# Patient Record
Sex: Male | Born: 1950 | Race: White | Hispanic: No | Marital: Married | Smoking: Former smoker
Health system: Southern US, Community
[De-identification: ages and names within clinical notes are randomized; demographics above are authoritative.]

## PROBLEM LIST (undated history)

## (undated) DIAGNOSIS — E785 Hyperlipidemia, unspecified: Secondary | ICD-10-CM

## (undated) DIAGNOSIS — I10 Essential (primary) hypertension: Secondary | ICD-10-CM

---

## 2000-11-07 ENCOUNTER — Ambulatory Visit (HOSPITAL_COMMUNITY): Admission: RE | Admit: 2000-11-07 | Discharge: 2000-11-07 | Payer: Self-pay | Admitting: Gastroenterology

## 2002-11-20 ENCOUNTER — Ambulatory Visit (HOSPITAL_COMMUNITY): Admission: RE | Admit: 2002-11-20 | Discharge: 2002-11-20 | Payer: Self-pay | Admitting: Orthopedic Surgery

## 2002-11-20 ENCOUNTER — Encounter: Payer: Self-pay | Admitting: Orthopedic Surgery

## 2003-07-05 ENCOUNTER — Inpatient Hospital Stay (HOSPITAL_COMMUNITY): Admission: EM | Admit: 2003-07-05 | Discharge: 2003-07-07 | Payer: Self-pay | Admitting: Emergency Medicine

## 2003-07-05 ENCOUNTER — Encounter: Payer: Self-pay | Admitting: Emergency Medicine

## 2003-07-30 ENCOUNTER — Ambulatory Visit (HOSPITAL_COMMUNITY): Admission: RE | Admit: 2003-07-30 | Discharge: 2003-07-30 | Payer: Self-pay | Admitting: Cardiology

## 2004-12-29 ENCOUNTER — Emergency Department (HOSPITAL_COMMUNITY): Admission: EM | Admit: 2004-12-29 | Discharge: 2004-12-30 | Payer: Self-pay | Admitting: Emergency Medicine

## 2012-02-08 ENCOUNTER — Encounter (HOSPITAL_COMMUNITY): Payer: Self-pay

## 2012-02-08 ENCOUNTER — Emergency Department (HOSPITAL_COMMUNITY)
Admission: EM | Admit: 2012-02-08 | Discharge: 2012-02-09 | Disposition: A | Discharge status: Home / Self Care | Payer: Worker's Compensation | Attending: Emergency Medicine | Admitting: Emergency Medicine

## 2012-02-08 ENCOUNTER — Encounter (HOSPITAL_COMMUNITY): Payer: Self-pay | Admitting: *Deleted

## 2012-02-08 ENCOUNTER — Emergency Department (INDEPENDENT_AMBULATORY_CARE_PROVIDER_SITE_OTHER)
Admission: EM | Admit: 2012-02-08 | Discharge: 2012-02-08 | Disposition: A | Discharge status: Nursing Home (Includes ICF) | Payer: BC Managed Care – PPO | Source: Home / Self Care | Attending: Emergency Medicine | Admitting: Emergency Medicine

## 2012-02-08 DIAGNOSIS — T23409A Corrosion of unspecified degree of unspecified hand, unspecified site, initial encounter: Secondary | ICD-10-CM

## 2012-02-08 DIAGNOSIS — Y9269 Other specified industrial and construction area as the place of occurrence of the external cause: Secondary | ICD-10-CM | POA: Insufficient documentation

## 2012-02-08 DIAGNOSIS — T23299A Burn of second degree of multiple sites of unspecified wrist and hand, initial encounter: Secondary | ICD-10-CM | POA: Insufficient documentation

## 2012-02-08 DIAGNOSIS — T23009A Burn of unspecified degree of unspecified hand, unspecified site, initial encounter: Secondary | ICD-10-CM

## 2012-02-08 DIAGNOSIS — T310 Burns involving less than 10% of body surface: Secondary | ICD-10-CM | POA: Insufficient documentation

## 2012-02-08 DIAGNOSIS — IMO0002 Reserved for concepts with insufficient information to code with codable children: Secondary | ICD-10-CM | POA: Insufficient documentation

## 2012-02-08 DIAGNOSIS — T304 Corrosion of unspecified body region, unspecified degree: Secondary | ICD-10-CM

## 2012-02-08 LAB — DIFFERENTIAL
Basophils Absolute: 0 10*3/uL (ref 0.0–0.1)
Basophils Relative: 0 % (ref 0–1)
Eosinophils Absolute: 0.1 10*3/uL (ref 0.0–0.7)
Eosinophils Relative: 1 % (ref 0–5)
Lymphocytes Relative: 17 % (ref 12–46)
Lymphs Abs: 2.1 10*3/uL (ref 0.7–4.0)
Monocytes Absolute: 0.6 10*3/uL (ref 0.1–1.0)
Monocytes Relative: 5 % (ref 3–12)
Neutro Abs: 9.5 10*3/uL — ABNORMAL HIGH (ref 1.7–7.7)
Neutrophils Relative %: 77 % (ref 43–77)

## 2012-02-08 LAB — CBC
HCT: 40.7 % (ref 39.0–52.0)
Hemoglobin: 14.4 g/dL (ref 13.0–17.0)
MCH: 31.9 pg (ref 26.0–34.0)
MCHC: 35.4 g/dL (ref 30.0–36.0)
MCV: 90.2 fL (ref 78.0–100.0)
Platelets: 226 10*3/uL (ref 150–400)
RBC: 4.51 MIL/uL (ref 4.22–5.81)
RDW: 13.2 % (ref 11.5–15.5)
WBC: 12.3 10*3/uL — ABNORMAL HIGH (ref 4.0–10.5)

## 2012-02-08 LAB — BASIC METABOLIC PANEL
BUN: 19 mg/dL (ref 6–23)
CO2: 26 mEq/L (ref 19–32)
Calcium: 9.2 mg/dL (ref 8.4–10.5)
Chloride: 103 mEq/L (ref 96–112)
Creatinine, Ser: 1.19 mg/dL (ref 0.50–1.35)
GFR calc Af Amer: 76 mL/min — ABNORMAL LOW (ref >90)
GFR calc non Af Amer: 65 mL/min — ABNORMAL LOW (ref >90)
Glucose, Bld: 122 mg/dL — ABNORMAL HIGH (ref 70–99)
Potassium: 3.7 mEq/L (ref 3.5–5.1)
Sodium: 142 mEq/L (ref 135–145)

## 2012-02-08 LAB — MAGNESIUM: Magnesium: 2.2 mg/dL (ref 1.5–2.5)

## 2012-02-08 LAB — CALCIUM: Calcium: 9.1 mg/dL (ref 8.4–10.5)

## 2012-02-08 MED ORDER — HYDROMORPHONE HCL PF 1 MG/ML IJ SOLN
INTRAMUSCULAR | Status: AC
  Filled 2012-02-08: qty 2

## 2012-02-08 MED ORDER — FENTANYL CITRATE 0.05 MG/ML IJ SOLN
50.0000 ug | Freq: Once | INTRAMUSCULAR | Status: AC
  Administered 2012-02-08: 50 ug via INTRAVENOUS
  Filled 2012-02-08: qty 2

## 2012-02-08 MED ORDER — HYDROMORPHONE HCL PF 1 MG/ML IJ SOLN
1.0000 mg | Freq: Once | INTRAMUSCULAR | Status: AC
  Administered 2012-02-08: 1 mg via INTRAVENOUS
  Filled 2012-02-08: qty 1

## 2012-02-08 MED ORDER — HYDROMORPHONE HCL PF 1 MG/ML IJ SOLN: 1.0000 mg | Freq: Once | INTRAMUSCULAR | Status: DC

## 2012-02-08 MED ORDER — CALCIUM GLUCONATE TOPICAL GEL 2.5 %
Freq: Once | TOPICAL | Status: DC
  Filled 2012-02-08: qty 100

## 2012-02-08 MED ORDER — DIPHENHYDRAMINE HCL 50 MG/ML IJ SOLN
12.5000 mg | Freq: Once | INTRAMUSCULAR | Status: AC
  Administered 2012-02-08: 12.5 mg via INTRAVENOUS
  Filled 2012-02-08: qty 1

## 2012-02-08 MED ORDER — ONDANSETRON 4 MG PO TBDP
ORAL_TABLET | ORAL | Status: AC
  Filled 2012-02-08: qty 1

## 2012-02-08 MED ORDER — SODIUM CHLORIDE 0.9 % IV SOLN
Freq: Once | INTRAVENOUS | Status: AC
  Administered 2012-02-08: 125 mL/h via INTRAVENOUS

## 2012-02-08 MED ORDER — HYDROMORPHONE HCL PF 2 MG/ML IJ SOLN
2.0000 mg | Freq: Once | INTRAMUSCULAR | Status: AC
  Administered 2012-02-08: 2 mg via INTRAVENOUS
  Filled 2012-02-08: qty 1

## 2012-02-08 MED ORDER — ONDANSETRON HCL 4 MG/2ML IJ SOLN
4.0000 mg | Freq: Once | INTRAMUSCULAR | Status: AC
  Administered 2012-02-08: 4 mg via INTRAVENOUS
  Filled 2012-02-08: qty 2

## 2012-02-08 MED ORDER — CALCIUM GLUCONATE TOPICAL GEL 2.5 %
Freq: Once | TOPICAL | Status: AC
  Administered 2012-02-08: 14:00:00 via TOPICAL

## 2012-02-08 MED ORDER — CALCIUM CARBONATE 1250 MG/5ML PO SUSP
6000.0000 mg | ORAL | Status: AC
  Administered 2012-02-08: 6000 mg
  Filled 2012-02-08: qty 60

## 2012-02-08 MED ORDER — HYDROMORPHONE HCL PF 1 MG/ML IJ SOLN
2.0000 mg | Freq: Once | INTRAMUSCULAR | Status: AC
Start: 2012-02-08 — End: 2012-02-08
  Administered 2012-02-08: 2 mg via INTRAMUSCULAR

## 2012-02-08 MED ORDER — PERCOCET 5-325 MG PO TABS: 1.0000 | ORAL_TABLET | ORAL | Status: AC | PRN

## 2012-02-08 MED ORDER — ONDANSETRON 4 MG PO TBDP
4.0000 mg | ORAL_TABLET | Freq: Once | ORAL | Status: AC
  Administered 2012-02-08: 4 mg via ORAL

## 2012-02-08 NOTE — Discharge Instructions (Signed)
As we discussed, please crush 20-30 Tums and mix with KY Jelly, coat your hand and put a loose fitting glove over it.  Massage your hand through the glove for 30 minutes.  You should do this every time you have increasing pain.  You will also need to wear the glove with the Tums mixture in it overnight.  If you continue to have pain tomorrow, please call the [facility] for follow up.  They know about you and will expect your call.  Please also follow up with the hand surgeon on Tuesday.  Take the pain medication as needed for pain, but do not use this instead of the glove and Tums ointment.  Do not take additional tylenol while using this pain medication.  You may return to the ER at any time for worsening condition or any new symptoms that concern you.   *If you are unable to find Tums or KY Jelly, please speak with a pharmacist.   You need to have calcium go through your skin per poison control.  This is very important.  If the pharmacist needs to, they may also contact poison control and reference your name to discuss your case*

## 2012-02-08 NOTE — ED Notes (Signed)
Pt reports he thought he was spraying degreaser on his hands but sprayed acid on them, causing chemical burns to his right hand. Pt arrives with hand covered in calcium gluconate. Medicated

## 2012-02-08 NOTE — ED Notes (Signed)
Pt presents with acid burns to R hand and fingers at 1100 today.  Pt reports they placed calcium gluconate to fingers, has glove on hand in triage.

## 2012-02-08 NOTE — ED Notes (Signed)
C/o itching since last medicine was given.  Mid level informed

## 2012-02-08 NOTE — ED Provider Notes (Signed)
History     CSN: [id number]  Arrival date & time 02/08/12  1303   First MD Initiated Contact with Patient 02/08/12 1331      Chief Complaint  Patient presents with  . Chemical Exposure    (Consider location/radiation/quality/duration/timing/severity/associated sxs/prior treatment) HPI Comments: Patient is a right-handed male who spilled "hydro-foam concentrate", which contains phosphoric acid and hydrogen flouride onto his right hand at 11:00 today. States that it splashed from the knuckles to his fingertips. He was not wearing gloves. He irrigated it 30 minutes with water, then soaked it in an unknown chemical, thinks it was a detergent of some sort.  Pain is worse with holding his hand below his heart, and better with elevation. He is currently holding it above his head. No limitation of motion, break in skin. No nausea, vomiting, fevers. No h/o diabetes, pt is not a smoker.   ROS as noted in HPI. All other ROS negative.   Patient is a 61 y.o. male presenting with hand injury. The history is provided by the patient. No language interpreter was used.  Hand Injury     History reviewed. No pertinent past medical history.  History reviewed. No pertinent past surgical history.  No family history on file.  History  Substance Use Topics  . Smoking status: Not on file  . Smokeless tobacco: Not on file  . Alcohol Use: Not on file      Review of Systems  Allergies  Penicillins  Home Medications  No current outpatient prescriptions on file.  BP 164/100  Pulse 71  Temp(Src) 97 F (36.1 C) (Oral)  Resp 18  SpO2 98%  Physical Exam  Nursing note and vitals reviewed. Constitutional: He is oriented to person, place, and time. He appears well-developed and well-nourished. He appears distressed.  HENT:  Head: Normocephalic and atraumatic.  Eyes: Conjunctivae and EOM are normal.  Neck: Normal range of motion.  Cardiovascular: Normal rate.   Pulmonary/Chest: Effort normal.  No respiratory distress.  Abdominal: He exhibits no distension.  Musculoskeletal: Normal range of motion.       Right hand: normal sensation noted. Normal strength noted.       Hands:      Whitish discoloration of the fingertips. Nailbeds blanched. Skin intact. Sensation grossly intact. Patient is able to move all fingers.  Neurological: He is alert and oriented to person, place, and time.  Skin: Skin is warm and dry.  Psychiatric: His speech is normal and behavior is normal. His mood appears anxious.    ED Course  Procedures (including critical care time)  Labs Reviewed - No data to display No results found.   1. Chemical burn of hand    MDM  Patient appears to be in significant amount of pain. Has some blanching of his fingertips and nailbeds. Applied calcium gluconate gel  2.5% to hand, advise patient to massage this into his hand extensively esp into nailbeds, and put a glove top of this. Gave 2 g of Dilaudid and 4 mg of Zofran. Discussed case with Beth, at poison control. Concentration HFl is less than 20% in this product. She recommends baseline labs, especially calcium level. Poison control will contact [facility] for followup in several hours. Transferring to the ED.   Luiz Blare, MD 02/08/12 [phone]

## 2012-02-08 NOTE — ED Notes (Signed)
Pt given meal with okay from MD.  Pt has no rash, no shob but c/o itching

## 2012-02-08 NOTE — ED Notes (Signed)
Pt  Accidentally   pourse  An  Acid  Base  Product  On r  Hand       About  2  Hours  Ago       He  Felt  It  Burning  So  He  Washed  It   With  Water  For  At least  30  mins      As  Well  As    Following the  Guidelines  Of  The    MSDS       He  Has  Pain     In the hand  And  It   Appears  The  Same  As  The  Unaffected  Hand      -  He  Reports         The  Hand  Is  Worse      When it  Is  Hanging  Down        It  Burns  He  Reports      It  Feels  Better         At  This  Time             He  ios  Awake  And  Alert  And  Oriented

## 2012-02-08 NOTE — ED Notes (Addendum)
Dr Edmonia James Surgeon at bedside.

## 2012-02-08 NOTE — ED Notes (Signed)
pts    boss With  Him   And  Says  At  This  Point  They  Are  Not      Using  workmans     comp

## 2012-02-08 NOTE — ED Notes (Signed)
msds  Data  Sheet  Sent  With patient

## 2012-02-08 NOTE — Consult Note (Signed)
Reason for Consult:CHEMICAL BURN TO HAND Referring Physician: DR. Morley Kos is an 61 y.o. male.  HPI: ER NOTES REVIEWED PT AT WORK SUSTAINED CHEMICAL BURN TO HAND PT RECEIVED ANTIDOTE AT URGENT CARE ER HAS BEEN MONITORING CALCIUM LEVEL PT C/O PAIN TO HAND  History reviewed. No pertinent past medical history.  History reviewed. No pertinent past surgical history.  No family history on file.  Social History:  does not have a smoking history on file. He does not have any smokeless tobacco history on file. His alcohol and drug histories not on file.  Allergies:  Allergies  Allergen Reactions  . Penicillins Other (See Comments)    Childhood reaction    Medications: I have reviewed the patient's current medications.  Results for orders placed during the hospital encounter of 02/08/12 (from the past 48 hour(s))  CBC     Status: Abnormal   Collection Time   02/08/12  3:17 PM      Component Value Range Comment   WBC 12.3 (*) 4.0 - 10.5 (K/uL)    RBC 4.51  4.22 - 5.81 (MIL/uL)    Hemoglobin 14.4  13.0 - 17.0 (g/dL)    HCT 16.1  09.6 - 04.5 (%)    MCV 90.2  78.0 - 100.0 (fL)    MCH 31.9  26.0 - 34.0 (pg)    MCHC 35.4  30.0 - 36.0 (g/dL)    RDW 40.9  81.1 - 91.4 (%)    Platelets 226  150 - 400 (K/uL)   DIFFERENTIAL     Status: Abnormal   Collection Time   02/08/12  3:17 PM      Component Value Range Comment   Neutrophils Relative 77  43 - 77 (%)    Neutro Abs 9.5 (*) 1.7 - 7.7 (K/uL)    Lymphocytes Relative 17  12 - 46 (%)    Lymphs Abs 2.1  0.7 - 4.0 (K/uL)    Monocytes Relative 5  3 - 12 (%)    Monocytes Absolute 0.6  0.1 - 1.0 (K/uL)    Eosinophils Relative 1  0 - 5 (%)    Eosinophils Absolute 0.1  0.0 - 0.7 (K/uL)    Basophils Relative 0  0 - 1 (%)    Basophils Absolute 0.0  0.0 - 0.1 (K/uL)   BASIC METABOLIC PANEL     Status: Abnormal   Collection Time   02/08/12  3:17 PM      Component Value Range Comment   Sodium 142  135 - 145 (mEq/L)    Potassium 3.7   3.5 - 5.1 (mEq/L)    Chloride 103  96 - 112 (mEq/L)    CO2 26  19 - 32 (mEq/L)    Glucose, Bld 122 (*) 70 - 99 (mg/dL)    BUN 19  6 - 23 (mg/dL)    Creatinine, Ser 7.82  0.50 - 1.35 (mg/dL)    Calcium 9.2  8.4 - 10.5 (mg/dL)    GFR calc non Af Amer 65 (*) >90 (mL/min)    GFR calc Af Amer 76 (*) >90 (mL/min)   CALCIUM     Status: Normal   Collection Time   02/08/12  7:31 PM      Component Value Range Comment   Calcium 9.1  8.4 - 10.5 (mg/dL)     No results found.  NO RECENT ILLNESSES OR HOSPITALIZATIONS  Blood pressure 124/80, pulse 74, temperature 98.2 F (36.8 C), temperature source Oral, resp. rate  18, height 5\' 11"  (1.803 m), weight 86.183 kg (190 lb), SpO2 96.00%.  GEN: ALERT ORIENTED TO P/P/T IN NO ACUTE DISTRESS TALKS IN NO RESPIRATORY DIFFICULTY RIGHT HAND: CALCIUM GLUCONATE OVER DORSUM AND PALMAR SURFACE OF HAND NO FULL THICKNESS OR PARTIAL THICKNESS WOUNDS ABLE TO MAKE FULL COMPOSITE FIST FINGERS WARM WELL PERFUSED NO ASCENDING ERYTHEMA OR LYMPHANGITIS   Assessment/Plan: RIGHT HAND HF ACID EXPOSURE  Pt RECEIVED TOPICAL CALCIUM GLUCONATE NO FULL THICKNESS SKIN INVOLVEMENT NO SKIN LOSS WOULD RECOMMEND SPLINTING HAND ICE ELEVATE ORAL PAIN MEDICATIONS F/U IN [facility] OFFICE THIS WEEK   Sharma Covert 02/08/2012, 9:59 PM

## 2012-02-08 NOTE — ED Provider Notes (Signed)
This chart was scribed for Gabriel Booze, MD by Wallis Mart. The patient was seen in room STRE6/STRE6 and the patient's care was started at 3:01 PM.   CSN: [id number]  Arrival date & time 02/08/12  1442   First MD Initiated Contact with Patient 02/08/12 1457      No chief complaint on file.   (Consider location/radiation/quality/duration/timing/severity/associated sxs/prior treatment) HPI  Gabriel Gross is a 61 y.o. male who presents to the Emergency Department complaining of a chemical burn to right fingers and hand that occurred around 11 AM today.  Pt states that acid (hydro-foam concentrate which contains phosphoric and hyrdrofluoric acid) splashed on his fingers and hand while at work, pt was not wearing gloves, he irrigated the wound 30 minutes later with water and then soaked it with an unknown detergent.  Pt went to urgent care where calcium gluconate was applied to his hand. Pt was also given Dilauded and Zofran and sent to the ED.  Pt currently rates pain as 5-10/10, states that pain has worsened since the incident occurred.  There are no other associated symptoms and no other alleviating or aggravating factors.     PCP: [facility]  History reviewed. No pertinent past medical history.  History reviewed. No pertinent past surgical history.  No family history on file.  History  Substance Use Topics  . Smoking status: Not on file  . Smokeless tobacco: Not on file  . Alcohol Use: Not on file      Review of Systems  Musculoskeletal:       Chemical burn to right hand  All other systems reviewed and are negative.    Allergies  Penicillins  Home Medications   Current Outpatient Rx  Name Route Sig Dispense Refill  . ACETAMINOPHEN 500 MG PO TABS Oral Take 1,000 mg by mouth every 6 (six) hours as needed. For pain    . IBUPROFEN 200 MG PO TABS Oral Take 400 mg by mouth every 6 (six) hours as needed. For pain      BP 108/67  Pulse 42  Temp(Src) 97.8 F (36.6  C) (Oral)  Resp 16  Ht 5\' 11"  (1.803 m)  Wt 190 lb (86.183 kg)  BMI 26.50 kg/m2  SpO2 97%  Physical Exam  Nursing note and vitals reviewed. Constitutional: He is oriented to person, place, and time. He appears well-developed and well-nourished. No distress.  HENT:  Head: Normocephalic and atraumatic.  Eyes: EOM are normal.  Neck: Neck supple. No tracheal deviation present.  Cardiovascular: Normal rate.   Pulmonary/Chest: Effort normal. No respiratory distress.  Musculoskeletal: Normal range of motion.       Mild pallor of right hand with no discrete blistering or obvious burn   Neurological: He is alert and oriented to person, place, and time.  Skin: Skin is warm and dry.  Psychiatric: He has a normal mood and affect. His behavior is normal.    ED Course  Procedures (including critical care time) DIAGNOSTIC STUDIES: Oxygen Saturation is 98% on room air, normal by my interpretation.    COORDINATION OF CARE:  WEST,EMILY B  4:26 PM Patient placed in CDU by Dr Preston Fleeting, awaiting hand surgery consult.  Pt reports pain is "20" out of 10, will redose dilaudid and continue to follow.    5:03 PM Pt notes minimal improvement with Dilaudid, though he appears much more comfortable.  Is able to move all digits.    7:07 PM Patient notes itching since fentanyl.  Dr Melvyn Novas has  not been in yet to see the patient.  Discussed with Dr Preston Fleeting who states patient may eat.    9:35 PM Patient with returned pain, worse than when he came in.  Pain radiates into right forearm.  Dilaudid ordered.  Still awaiting Dr Melvyn Novas.  Dr Preston Fleeting was awaiting a call from Dr Melvyn Novas at end of his shift (8pm) and was planning to touch base about this patient.  Pt with full AROM of fingers, capillary refill < 2 seconds in all digits, sensation intact.    9:54 PM Dr Melvyn Novas here to see patient.   10:03 PM Dr Melvyn Novas has seen and examined the patient, requests bulky dressing, volar splint, f/u in his office on Tuesday,  believes he can be discharged home if calcium level does not need to be monitored closely.  I am currently calling the poison control center to discuss how patient's calcium needs to be followed.     10:11 PM I spoke with poison control representative who recommends against bulky dressing and splint.  States that this process will get worse if we do not get calcium through the skin.  States this burn does not look bad but is very painful and must be treated this way.  Pt to have calcium gluconate gel (which pharmacy states we do not have) or Tums crushed in [place] Jelly.    10:25 PM I have spoken with the pharmacist who confirms we do not have calcium gluconate and states we do not have KY Jelly, is concerned about surgi-lube interacting with the calcium.  Recommends calcium carbonate liquid, as this is what is available.    10:50 PM I have spoken with Dr Golda Acre who agrees with following poison control recommendations.  Patient currently reporting pain is 10/10 (actually "200" out of 10).  Pt states he will not tolerate trial with massage without IV pain medication, which I have ordered.    11:34 PM Patient reports massage greatly improved his pain.  States he is now 7/10.  States he can do this at home as I have explained the tums/ky jelly mixture and he states he would like to go home.  Called poison control again, states he should repeat as needed and definitely leave the glove on overnight.  Pt to call poison control tomorrow for follow up if he is still in pain.  Pt d/c home with care instructions, pain medication for additional pain control.  Patient verbalizes understanding and agrees with plan.    End section by WEST,EMILY B    Results for orders placed during the hospital encounter of 02/08/12  CBC      Component Value Range   WBC 12.3 (*) 4.0 - 10.5 K/uL   RBC 4.51  4.22 - 5.81 MIL/uL   Hemoglobin 14.4  13.0 - 17.0 g/dL   HCT 47.8  29.5 - 62.1 %   MCV 90.2  78.0 - 100.0 fL   MCH 31.9  26.0  - 34.0 pg   MCHC 35.4  30.0 - 36.0 g/dL   RDW 30.8  65.7 - 84.6 %   Platelets 226  150 - 400 K/uL  DIFFERENTIAL      Component Value Range   Neutrophils Relative 77  43 - 77 %   Neutro Abs 9.5 (*) 1.7 - 7.7 K/uL   Lymphocytes Relative 17  12 - 46 %   Lymphs Abs 2.1  0.7 - 4.0 K/uL   Monocytes Relative 5  3 - 12 %   Monocytes  Absolute 0.6  0.1 - 1.0 K/uL   Eosinophils Relative 1  0 - 5 %   Eosinophils Absolute 0.1  0.0 - 0.7 K/uL   Basophils Relative 0  0 - 1 %   Basophils Absolute 0.0  0.0 - 0.1 K/uL  BASIC METABOLIC PANEL      Component Value Range   Sodium 142  135 - 145 mEq/L   Potassium 3.7  3.5 - 5.1 mEq/L   Chloride 103  96 - 112 mEq/L   CO2 26  19 - 32 mEq/L   Glucose, Bld 122 (*) 70 - 99 mg/dL   BUN 19  6 - 23 mg/dL   Creatinine, Ser 5.62  0.50 - 1.35 mg/dL   Calcium 9.2  8.4 - 13.0 mg/dL   GFR calc non Af Amer 65 (*) >90 mL/min   GFR calc Af Amer 76 (*) >90 mL/min  CALCIUM      Component Value Range   Calcium 9.1  8.4 - 10.5 mg/dL  MAGNESIUM      Component Value Range   Magnesium 2.2  1.5 - 2.5 mg/dL     1. Chemical burn       MDM  Patient with chemical burn to hand, followed poison control instructions of topical calcium which helped patient.  Calcium and magnesium levels normal, calcium with no significant change in ED.  Pt d/c home with instructions per poison control.  Patient verbalizes understanding and agrees with plan.      I personally performed the services described in this documentation, which was scribed in my presence. The recorded information has been reviewed and considered.      Gabriel Booze, MD 03/03/12 [phone]

## 2012-02-08 NOTE — ED Notes (Addendum)
Wife at bedside.  Room darkened for comfort

## 2012-02-08 NOTE — ED Notes (Signed)
Dr Fredric Dine  With    Poison  control

## 2015-09-26 ENCOUNTER — Encounter (HOSPITAL_COMMUNITY): Payer: Self-pay | Admitting: Emergency Medicine

## 2015-09-26 ENCOUNTER — Inpatient Hospital Stay (HOSPITAL_COMMUNITY)
Admission: EM | Admit: 2015-09-26 | Discharge: 2015-09-28 | DRG: 193 | Disposition: A | Discharge status: Home / Self Care | Payer: BLUE CROSS/BLUE SHIELD | Attending: Family Medicine | Admitting: Family Medicine

## 2015-09-26 ENCOUNTER — Inpatient Hospital Stay (HOSPITAL_COMMUNITY): Payer: BLUE CROSS/BLUE SHIELD

## 2015-09-26 ENCOUNTER — Emergency Department (HOSPITAL_COMMUNITY): Payer: BLUE CROSS/BLUE SHIELD

## 2015-09-26 DIAGNOSIS — R0602 Shortness of breath: Secondary | ICD-10-CM | POA: Diagnosis not present

## 2015-09-26 DIAGNOSIS — E785 Hyperlipidemia, unspecified: Secondary | ICD-10-CM | POA: Diagnosis present

## 2015-09-26 DIAGNOSIS — Z880 Allergy status to penicillin: Secondary | ICD-10-CM | POA: Diagnosis not present

## 2015-09-26 DIAGNOSIS — J9601 Acute respiratory failure with hypoxia: Secondary | ICD-10-CM | POA: Diagnosis present

## 2015-09-26 DIAGNOSIS — E860 Dehydration: Secondary | ICD-10-CM | POA: Diagnosis present

## 2015-09-26 DIAGNOSIS — J181 Lobar pneumonia, unspecified organism: Principal | ICD-10-CM | POA: Diagnosis present

## 2015-09-26 DIAGNOSIS — J189 Pneumonia, unspecified organism: Secondary | ICD-10-CM | POA: Diagnosis not present

## 2015-09-26 DIAGNOSIS — E876 Hypokalemia: Secondary | ICD-10-CM | POA: Diagnosis present

## 2015-09-26 DIAGNOSIS — J180 Bronchopneumonia, unspecified organism: Secondary | ICD-10-CM | POA: Diagnosis present

## 2015-09-26 DIAGNOSIS — T502X5A Adverse effect of carbonic-anhydrase inhibitors, benzothiadiazides and other diuretics, initial encounter: Secondary | ICD-10-CM | POA: Diagnosis present

## 2015-09-26 DIAGNOSIS — Z87891 Personal history of nicotine dependence: Secondary | ICD-10-CM

## 2015-09-26 DIAGNOSIS — I10 Essential (primary) hypertension: Secondary | ICD-10-CM | POA: Diagnosis present

## 2015-09-26 DIAGNOSIS — Z79899 Other long term (current) drug therapy: Secondary | ICD-10-CM | POA: Diagnosis not present

## 2015-09-26 DIAGNOSIS — D72829 Elevated white blood cell count, unspecified: Secondary | ICD-10-CM | POA: Diagnosis present

## 2015-09-26 DIAGNOSIS — N179 Acute kidney failure, unspecified: Secondary | ICD-10-CM | POA: Diagnosis present

## 2015-09-26 HISTORY — DX: Essential (primary) hypertension: I10

## 2015-09-26 HISTORY — DX: Hyperlipidemia, unspecified: E78.5

## 2015-09-26 LAB — CBC
HEMATOCRIT: 41.1 % (ref 39.0–52.0)
Hemoglobin: 14.1 g/dL (ref 13.0–17.0)
MCH: 32 pg (ref 26.0–34.0)
MCHC: 34.3 g/dL (ref 30.0–36.0)
MCV: 93.4 fL (ref 78.0–100.0)
Platelets: 482 10*3/uL — ABNORMAL HIGH (ref 150–400)
RBC: 4.4 MIL/uL (ref 4.22–5.81)
RDW: 12.6 % (ref 11.5–15.5)
WBC: 15.2 10*3/uL — ABNORMAL HIGH (ref 4.0–10.5)

## 2015-09-26 LAB — URINALYSIS, ROUTINE W REFLEX MICROSCOPIC
GLUCOSE, UA: NEGATIVE mg/dL
Hgb urine dipstick: NEGATIVE
KETONES UR: NEGATIVE mg/dL
Leukocytes, UA: NEGATIVE
NITRITE: NEGATIVE
PH: 5.5 (ref 5.0–8.0)
Protein, ur: NEGATIVE mg/dL
Specific Gravity, Urine: 1.027 (ref 1.005–1.030)

## 2015-09-26 LAB — PROCALCITONIN: Procalcitonin: <0.1 ng/mL

## 2015-09-26 LAB — BASIC METABOLIC PANEL
Anion gap: 13 (ref 5–15)
BUN: 26 mg/dL — AB (ref 6–20)
CHLORIDE: 99 mmol/L — AB (ref 101–111)
CO2: 27 mmol/L (ref 22–32)
CREATININE: 1.25 mg/dL — AB (ref 0.61–1.24)
Calcium: 9.1 mg/dL (ref 8.9–10.3)
GFR calc Af Amer: >60 mL/min (ref >60)
GFR, EST NON AFRICAN AMERICAN: 60 mL/min — AB (ref >60)
GLUCOSE: 134 mg/dL — AB (ref 65–99)
Potassium: 3.4 mmol/L — ABNORMAL LOW (ref 3.5–5.1)
Sodium: 139 mmol/L (ref 135–145)

## 2015-09-26 LAB — TROPONIN I

## 2015-09-26 LAB — INFLUENZA PANEL BY PCR (TYPE A & B)
H1N1FLUPCR: NOT DETECTED
INFLAPCR: NEGATIVE
INFLBPCR: NEGATIVE

## 2015-09-26 LAB — TSH: TSH: 1.929 u[IU]/mL (ref 0.350–4.500)

## 2015-09-26 LAB — STREP PNEUMONIAE URINARY ANTIGEN: Strep Pneumo Urinary Antigen: NEGATIVE

## 2015-09-26 LAB — MAGNESIUM: MAGNESIUM: 2 mg/dL (ref 1.7–2.4)

## 2015-09-26 LAB — I-STAT CG4 LACTIC ACID, ED: Lactic Acid, Venous: 1.96 mmol/L (ref 0.5–2.0)

## 2015-09-26 LAB — CBG MONITORING, ED: Glucose-Capillary: 109 mg/dL — ABNORMAL HIGH (ref 65–99)

## 2015-09-26 LAB — LACTIC ACID, PLASMA: Lactic Acid, Venous: 1.5 mmol/L (ref 0.5–2.0)

## 2015-09-26 MED ORDER — IOHEXOL 350 MG/ML SOLN
100.0000 mL | Freq: Once | INTRAVENOUS | Status: AC | PRN
  Administered 2015-09-26: 100 mL via INTRAVENOUS

## 2015-09-26 MED ORDER — SODIUM CHLORIDE 0.9 % IV BOLUS (SEPSIS)
500.0000 mL | Freq: Once | INTRAVENOUS | Status: AC
  Administered 2015-09-26: 500 mL via INTRAVENOUS

## 2015-09-26 MED ORDER — DEXTROSE 5 % IV SOLN
500.0000 mg | INTRAVENOUS | Status: DC
  Administered 2015-09-26 – 2015-09-27 (×2): 500 mg via INTRAVENOUS
  Filled 2015-09-26 (×4): qty 500

## 2015-09-26 MED ORDER — ALBUTEROL SULFATE (2.5 MG/3ML) 0.083% IN NEBU
5.0000 mg | INHALATION_SOLUTION | Freq: Once | RESPIRATORY_TRACT | Status: AC
  Administered 2015-09-26: 5 mg via RESPIRATORY_TRACT
  Filled 2015-09-26: qty 6

## 2015-09-26 MED ORDER — POTASSIUM CHLORIDE IN NACL 20-0.9 MEQ/L-% IV SOLN
INTRAVENOUS | Status: DC
Start: 2015-09-26 — End: 2015-09-28
  Administered 2015-09-26: 125 mL/h via INTRAVENOUS
  Administered 2015-09-27: 15:00:00 via INTRAVENOUS
  Filled 2015-09-26 (×5): qty 1000

## 2015-09-26 MED ORDER — ENOXAPARIN SODIUM 40 MG/0.4ML ~~LOC~~ SOLN
40.0000 mg | SUBCUTANEOUS | Status: DC
  Administered 2015-09-26 – 2015-09-27 (×2): 40 mg via SUBCUTANEOUS
  Filled 2015-09-26 (×3): qty 0.4

## 2015-09-26 MED ORDER — SIMVASTATIN 20 MG PO TABS
20.0000 mg | ORAL_TABLET | Freq: Every evening | ORAL | Status: DC
  Administered 2015-09-26 – 2015-09-27 (×2): 20 mg via ORAL
  Filled 2015-09-26 (×3): qty 1

## 2015-09-26 MED ORDER — DEXTROSE 5 % IV SOLN
1.0000 g | INTRAVENOUS | Status: DC
  Administered 2015-09-26 – 2015-09-27 (×2): 1 g via INTRAVENOUS
  Filled 2015-09-26 (×3): qty 10

## 2015-09-26 MED ORDER — ONDANSETRON HCL 4 MG/2ML IJ SOLN: 4.0000 mg | Freq: Four times a day (QID) | INTRAMUSCULAR | Status: DC | PRN

## 2015-09-26 MED ORDER — PANTOPRAZOLE SODIUM 40 MG PO TBEC
40.0000 mg | DELAYED_RELEASE_TABLET | Freq: Every day | ORAL | Status: DC
  Administered 2015-09-26 – 2015-09-28 (×3): 40 mg via ORAL
  Filled 2015-09-26 (×5): qty 1

## 2015-09-26 MED ORDER — GUAIFENESIN 100 MG/5ML PO SOLN
200.0000 mg | Freq: Three times a day (TID) | ORAL | Status: DC | PRN
  Administered 2015-09-27: 200 mg via ORAL
  Filled 2015-09-26: qty 10

## 2015-09-26 MED ORDER — PREDNISONE 20 MG PO TABS
40.0000 mg | ORAL_TABLET | Freq: Every day | ORAL | Status: DC
Start: 2015-09-26 — End: 2015-09-28
  Administered 2015-09-26 – 2015-09-28 (×3): 40 mg via ORAL
  Filled 2015-09-26 (×4): qty 2

## 2015-09-26 NOTE — ED Notes (Addendum)
Pt reports ongoing cough/weakness for 3 weeks; sent for evaluation from urgent care post confirmed pneumonia.

## 2015-09-26 NOTE — Progress Notes (Signed)
Pt confirms pcp as [facility] managerLisa Gross EPIC updated

## 2015-09-26 NOTE — Progress Notes (Signed)
Pt transported from the ED to room 1504. Pt on droplet and contact precautions. AO x 4. Pt belongings at bedside. Pt made aware of unit procedures. No questions or concerns at this time.  Crosby Bevan W Maliyah Willets, RN

## 2015-09-26 NOTE — H&P (Signed)
[facility] History and Physical  Gabriel Gross RUE:[id number] DOB: 04/23/1951 DOA: 09/26/2015  Referring physician:  PCP: Neldon Labella, MD   Chief Complaint: Shortness of breath  HPI:  65 year old male with a history of dyslipidemia, hypertension, previous smoker quit 40 years ago, presents with a one-week history of shortness of breath, cough, brought in today from urgent care, due to failing outpatient treatment for pneumonia with Levaquin 500 mg by mouth daily 7 days. He endorses progressive shortness of breath with activity. Patient had some associated dizziness and palpitations, some pleuritic chest pain and worsening dyspnea on exertion.BP soft in 90's, patient states his blood pressure usually runs in the 200s. Chest x-ray confirms early bronchopneumonia. Lactic acid 1.5. White count 15.2. UA negative    Review of Systems: negative for the following  Constitutional: Positive for fever, chills, diaphoresis, appetite change and fatigue.  HEENT: Denies photophobia, eye pain, redness, hearing loss, ear pain, congestion, sore throat, rhinorrhea, sneezing, mouth sores, trouble swallowing, neck pain, neck stiffness and tinnitus.  Respiratory: Positive for SOB, DOE, cough, chest tightness, and wheezing.  Cardiovascular: Denies chest pain, palpitations and leg swelling.  Gastrointestinal: Denies nausea, vomiting, abdominal pain, diarrhea, constipation, blood in stool and abdominal distention.  Genitourinary: Denies dysuria, urgency, frequency, hematuria, flank pain and difficulty urinating.  Musculoskeletal: Denies myalgias, back pain, joint swelling, arthralgias and gait problem.  Skin: Denies pallor, rash and wound.  Neurological: Denies dizziness, seizures, syncope, weakness, light-headedness, numbness and headaches.  Hematological: Denies adenopathy. Easy bruising, personal or family bleeding history  Psychiatric/Behavioral: Denies suicidal ideation, mood changes, confusion,  nervousness, sleep disturbance and agitation       Past Medical History  Diagnosis Date  . Hyperlipemia   . Hypertension      History reviewed. No pertinent past surgical history.    Social History:  reports that he has quit smoking. He does not have any smokeless tobacco history on file. He reports that he does not drink alcohol or use illicit drugs.    Allergies  Allergen Reactions  . Penicillins Other (See Comments)    Childhood reaction Has patient had a PCN reaction causing immediate rash, facial/tongue/throat swelling, SOB or lightheadedness with hypotension: unknown Has patient had a PCN reaction causing severe rash involving mucus membranes or skin necrosis: unknown Has patient had a PCN reaction that required hospitalization unknown Has patient had a PCN reaction occurring within the last 10 years: No If all of the above answers are "NO", then may proceed with Cephalosporin use.         FAMILY HISTORY  When questioned  Directly-patient reports  No family history of HTN, CVA ,DIABETES, TB, Cancer CAD, Bleeding Disorders, Sickle Cell, diabetes, anemia, asthma,   Prior to Admission medications   Medication Sig Start Date End Date Taking? Authorizing Provider  acetaminophen (TYLENOL) 500 MG tablet Take 1,000 mg by mouth every 6 (six) hours as needed. For pain   Yes Historical Provider, MD  guaiFENesin (ROBITUSSIN) 100 MG/5ML liquid Take 200 mg by mouth 3 (three) times daily as needed for cough.   Yes Historical Provider, MD  ibuprofen (ADVIL,MOTRIN) 200 MG tablet Take 400 mg by mouth every 6 (six) hours as needed. For pain   Yes Historical Provider, MD  levofloxacin (LEVAQUIN) 500 MG tablet Take 500 mg by mouth daily.   Yes Historical Provider, MD  losartan-hydrochlorothiazide (HYZAAR) 100-25 MG tablet Take 1 tablet by mouth daily.   Yes Historical Provider, MD  omeprazole (PRILOSEC) 20 MG capsule Take 20  mg by mouth every other day.   Yes Historical Provider, MD   simvastatin (ZOCOR) 20 MG tablet Take 20 mg by mouth daily.   Yes Historical Provider, MD     Physical Exam: Filed Vitals:   09/26/15 1212 09/26/15 1431 09/26/15 1630  BP: 94/68 110/77 115/82  Pulse: 96 105 84  Temp: 98.1 F (36.7 C) 97.9 F (36.6 C)   TempSrc: Oral Oral   Resp: 18 22 22   Height: 5\' 10"  (1.778 m)    Weight: 89.812 kg (198 lb)    SpO2: 96% 93% 97%     Constitutional: Vital signs reviewed. Patient is a well-developed and well-nourished in no acute distress and cooperative with exam. Alert and oriented x3.  Head: Normocephalic and atraumatic  Ear: TM normal bilaterally  Mouth: no erythema or exudates, MMM  Eyes: PERRL, EOMI, conjunctivae normal, No scleral icterus.  Neck: Supple, Trachea midline normal ROM, No JVD, mass, thyromegaly, or carotid bruit present.  Cardiovascular: RRR, S1 normal, S2 normal, no MRG, pulses symmetric and intact bilaterally  Pulmonary/Chest: Effort normal. No stridor. No respiratory distress. He has decreased breath sounds in the left middle field and the left lower field. He has no wheezes. He has no rhonchi. He has no rales.  Abdominal: Soft. Non-tender, non-distended, bowel sounds are normal, no masses, organomegaly, or guarding present.  GU: no CVA tenderness Musculoskeletal: No joint deformities, erythema, or stiffness, ROM full and no nontender Ext: no edema and no cyanosis, pulses palpable bilaterally (DP and PT)  Hematology: no cervical, inginal, or axillary adenopathy.  Neurological: A&O x3, Strenght is normal and symmetric bilaterally, cranial nerve II-XII are grossly intact, no focal motor deficit, sensory intact to light touch bilaterally.  Skin: Warm, dry and intact. No rash, cyanosis, or clubbing.  Psychiatric: Normal mood and affect. speech and behavior is normal. Judgment and thought content normal. Cognition and memory are normal.      Data Review   Micro Results No results found for this or any previous visit (from  the past 240 hour(s)).  Radiology Reports Dg Chest 2 View  09/26/2015  CLINICAL DATA:  65 year old male with shortness breath EXAM: CHEST  2 VIEW COMPARISON:  Prior chest x-ray 09/21/2015 FINDINGS: Cardiac and mediastinal contours are within normal limits. Atherosclerotic calcifications again noted in the transverse aorta. Slightly increased streaky airspace opacities in the left lower lobe compared to prior imaging. Otherwise, the lungs are clear save for mild chronic bronchitic change. No pleural effusion, edema or pneumothorax. No acute osseous abnormality. Visualized osseous structures demonstrate no acute abnormality. IMPRESSION: Increased streaky airspace opacities in the left lower lobe in a peribronchovascular distribution. Differential considerations include subsegmental atelectasis in setting of acute airway inflammation or mucous plugging, acute on chronic bronchitis, and in the appropriate clinical setting, early bronchopneumonia. Electronically Signed   By: Malachy Moan M.D.   On: 09/26/2015 13:20     CBC  Recent Labs Lab 09/26/15 1231  WBC 15.2*  HGB 14.1  HCT 41.1  PLT 482*  MCV 93.4  MCH 32.0  MCHC 34.3  RDW 12.6    Chemistries   Recent Labs Lab 09/26/15 1231 09/26/15 1517  NA 139  --   K 3.4*  --   CL 99*  --   CO2 27  --   GLUCOSE 134*  --   BUN 26*  --   CREATININE 1.25*  --   CALCIUM 9.1  --   MG  --  2.0   ------------------------------------------------------------------------------------------------------------------ estimated  creatinine clearance is 68.2 mL/min (by C-G formula based on Cr of 1.25). ------------------------------------------------------------------------------------------------------------------ No results for input(s): HGBA1C in the last 72 hours. ------------------------------------------------------------------------------------------------------------------ No results for input(s): CHOL, HDL, LDLCALC, TRIG, CHOLHDL, LDLDIRECT  in the last 72 hours. ------------------------------------------------------------------------------------------------------------------ No results for input(s): TSH, T4TOTAL, T3FREE, THYROIDAB in the last 72 hours.  Invalid input(s): FREET3 ------------------------------------------------------------------------------------------------------------------ No results for input(s): VITAMINB12, FOLATE, FERRITIN, TIBC, IRON, RETICCTPCT in the last 72 hours.  Coagulation profile No results for input(s): INR, PROTIME in the last 168 hours.  No results for input(s): DDIMER in the last 72 hours.  Cardiac Enzymes  Recent Labs Lab 09/26/15 1517  TROPONINI <0.03   ------------------------------------------------------------------------------------------------------------------ Invalid input(s): POCBNP   CBG:  Recent Labs Lab 09/26/15 1252  GLUCAP 109*       EKG: Independently reviewed.    Assessment/Plan Active Problems:   CAP (community acquired pneumonia) Started on Rocephin and azithromycin after failing outpatient treatment with Levaquin 500 7 days Given shortness of breath and dizziness hypotension, will rule out PE Obtain CT chest, PE protocol Initiate pneumonia order set  Check influenza PCR, HIV antibody, Legionella and strep pneumo antigen in the urine Follow blood culture 2 Nebulizer treatments, start oral prednisone for slight wheezing  Hypokalemia likely secondary to HCTZ in the setting of dehydration Hold antihypertensive medications  Essential hypertension-blood pressure soft Hydrate with IV fluids and hold hydrochlorothiazide/HCTZ       Code Status:   full Family Communication: bedside Disposition Plan: admit   Total time spent 55 minutes.Greater than 50% of this time was spent in counseling, explanation of diagnosis, planning of further management, and coordination of care  [facility] HospitalBROL,Tameko Halder [facility] Pager [phone]  If 7PM-7AM, please  contact night-coverage [email or web address] Password [facility] [email or web address] 09/26/2015, 4:46 PM

## 2015-09-26 NOTE — ED Provider Notes (Signed)
CSN: [id number]     Arrival date & time 09/26/15  1147 History   First MD Initiated Contact with Patient 09/26/15 1347     Chief Complaint  Patient presents with  . Pneumonia  . Dizziness     (Consider location/radiation/quality/duration/timing/severity/associated sxs/prior Treatment) HPI Comments: Pt here from urgent care after failing outpt tx for pna w/ levaquin Endorses cough, sob, fever and weakness x 7 days.. Denies emesis, diarrhea, rashes Sx progressively worse with activity Nothing makes them better  Patient is a 65 y.o. male presenting with pneumonia and dizziness. The history is provided by the patient.  Pneumonia This is a recurrent problem. The current episode started more than 1 week ago. The problem occurs constantly. The problem has been rapidly worsening. Pertinent negatives include no chest pain. The symptoms are aggravated by walking.  Dizziness Associated symptoms: no chest pain     Past Medical History  Diagnosis Date  . Hyperlipemia   . Hypertension    History reviewed. No pertinent past surgical history. No family history on file. Social History  Substance Use Topics  . Smoking status: Former Games developermoker  . Smokeless tobacco: None  . Alcohol Use: No    Review of Systems  Cardiovascular: Negative for chest pain.  Neurological: Positive for dizziness.  All other systems reviewed and are negative.     Allergies  Penicillins  Home Medications   Prior to Admission medications   Medication Sig Start Date End Date Taking? Authorizing Provider  acetaminophen (TYLENOL) 500 MG tablet Take 1,000 mg by mouth every 6 (six) hours as needed. For pain   Yes Historical Provider, MD  guaiFENesin (ROBITUSSIN) 100 MG/5ML liquid Take 200 mg by mouth 3 (three) times daily as needed for cough.   Yes Historical Provider, MD  ibuprofen (ADVIL,MOTRIN) 200 MG tablet Take 400 mg by mouth every 6 (six) hours as needed. For pain   Yes Historical Provider, MD  levofloxacin  (LEVAQUIN) 500 MG tablet Take 500 mg by mouth daily.   Yes Historical Provider, MD  losartan-hydrochlorothiazide (HYZAAR) 100-25 MG tablet Take 1 tablet by mouth daily.   Yes Historical Provider, MD  omeprazole (PRILOSEC) 20 MG capsule Take 20 mg by mouth every other day.   Yes Historical Provider, MD  simvastatin (ZOCOR) 20 MG tablet Take 20 mg by mouth daily.   Yes Historical Provider, MD   BP 94/68 mmHg  Pulse 96  Temp(Src) 98.1 F (36.7 C) (Oral)  Resp 18  Ht 5\' 10"  (1.778 m)  Wt 89.812 kg  BMI 28.41 kg/m2  SpO2 96% Physical Exam  Constitutional: He is oriented to person, place, and time. He appears well-developed and well-nourished.  Non-toxic appearance. No distress.  HENT:  Head: Normocephalic and atraumatic.  Eyes: Conjunctivae, EOM and lids are normal. Pupils are equal, round, and reactive to light.  Neck: Normal range of motion. Neck supple. No tracheal deviation present. No thyroid mass present.  Cardiovascular: Normal rate, regular rhythm and normal heart sounds.  Exam reveals no gallop.   No murmur heard. Pulmonary/Chest: Effort normal. No stridor. No respiratory distress. He has decreased breath sounds in the left middle field and the left lower field. He has no wheezes. He has no rhonchi. He has no rales.  Abdominal: Soft. Normal appearance and bowel sounds are normal. He exhibits no distension. There is no tenderness. There is no rebound and no CVA tenderness.  Musculoskeletal: Normal range of motion. He exhibits no edema or tenderness.  Neurological: He is alert and  oriented to person, place, and time. He has normal strength. No cranial nerve deficit or sensory deficit. GCS eye subscore is 4. GCS verbal subscore is 5. GCS motor subscore is 6.  Skin: Skin is warm and dry. No abrasion and no rash noted.  Psychiatric: He has a normal mood and affect. His speech is normal and behavior is normal.  Nursing note and vitals reviewed.   ED Course  Procedures (including  critical care time) Labs Review Labs Reviewed  BASIC METABOLIC PANEL - Abnormal; Notable for the following:    Potassium 3.4 (*)    Chloride 99 (*)    Glucose, Bld 134 (*)    BUN 26 (*)    Creatinine, Ser 1.25 (*)    GFR calc non Af Amer 60 (*)    All other components within normal limits  CBC - Abnormal; Notable for the following:    WBC 15.2 (*)    Platelets 482 (*)    All other components within normal limits  CBG MONITORING, ED - Abnormal; Notable for the following:    Glucose-Capillary 109 (*)    All other components within normal limits  CULTURE, BLOOD (ROUTINE X 2)  CULTURE, BLOOD (ROUTINE X 2)  URINALYSIS, ROUTINE W REFLEX MICROSCOPIC (NOT AT [facility])  I-STAT CG4 LACTIC ACID, ED    Imaging Review Dg Chest 2 View  09/26/2015  CLINICAL DATA:  65 year old male with shortness breath EXAM: CHEST  2 VIEW COMPARISON:  Prior chest x-ray 09/21/2015 FINDINGS: Cardiac and mediastinal contours are within normal limits. Atherosclerotic calcifications again noted in the transverse aorta. Slightly increased streaky airspace opacities in the left lower lobe compared to prior imaging. Otherwise, the lungs are clear save for mild chronic bronchitic change. No pleural effusion, edema or pneumothorax. No acute osseous abnormality. Visualized osseous structures demonstrate no acute abnormality. IMPRESSION: Increased streaky airspace opacities in the left lower lobe in a peribronchovascular distribution. Differential considerations include subsegmental atelectasis in setting of acute airway inflammation or mucous plugging, acute on chronic bronchitis, and in the appropriate clinical setting, early bronchopneumonia. Electronically Signed   By: Malachy Moan M.D.   On: 09/26/2015 13:20   I have personally reviewed and evaluated these images and lab results as part of my medical decision-making.   EKG Interpretation None      MDM   Final diagnoses:  None    Chest x-ray consistent with  pneumonia and patient be admitted to the hospitalist service    Lorre Nick, MD 09/28/15 1122

## 2015-09-27 ENCOUNTER — Encounter (HOSPITAL_COMMUNITY): Payer: Self-pay | Admitting: Internal Medicine

## 2015-09-27 DIAGNOSIS — E785 Hyperlipidemia, unspecified: Secondary | ICD-10-CM

## 2015-09-27 DIAGNOSIS — D72829 Elevated white blood cell count, unspecified: Secondary | ICD-10-CM

## 2015-09-27 DIAGNOSIS — J181 Lobar pneumonia, unspecified organism: Secondary | ICD-10-CM | POA: Diagnosis present

## 2015-09-27 DIAGNOSIS — J9601 Acute respiratory failure with hypoxia: Secondary | ICD-10-CM | POA: Diagnosis present

## 2015-09-27 DIAGNOSIS — N179 Acute kidney failure, unspecified: Secondary | ICD-10-CM

## 2015-09-27 DIAGNOSIS — E876 Hypokalemia: Secondary | ICD-10-CM

## 2015-09-27 LAB — COMPREHENSIVE METABOLIC PANEL
ALBUMIN: 2.9 g/dL — AB (ref 3.5–5.0)
ALT: 51 U/L (ref 17–63)
ANION GAP: 9 (ref 5–15)
AST: 30 U/L (ref 15–41)
Alkaline Phosphatase: 75 U/L (ref 38–126)
BUN: 23 mg/dL — ABNORMAL HIGH (ref 6–20)
CHLORIDE: 104 mmol/L (ref 101–111)
CO2: 26 mmol/L (ref 22–32)
Calcium: 8.6 mg/dL — ABNORMAL LOW (ref 8.9–10.3)
Creatinine, Ser: 1.08 mg/dL (ref 0.61–1.24)
GFR calc Af Amer: >60 mL/min (ref >60)
GFR calc non Af Amer: >60 mL/min (ref >60)
GLUCOSE: 154 mg/dL — AB (ref 65–99)
POTASSIUM: 4.8 mmol/L (ref 3.5–5.1)
SODIUM: 139 mmol/L (ref 135–145)
Total Bilirubin: 0.9 mg/dL (ref 0.3–1.2)
Total Protein: 6.6 g/dL (ref 6.5–8.1)

## 2015-09-27 LAB — CBC
HEMATOCRIT: 37.8 % — AB (ref 39.0–52.0)
HEMOGLOBIN: 12.8 g/dL — AB (ref 13.0–17.0)
MCH: 32 pg (ref 26.0–34.0)
MCHC: 33.9 g/dL (ref 30.0–36.0)
MCV: 94.5 fL (ref 78.0–100.0)
Platelets: 473 10*3/uL — ABNORMAL HIGH (ref 150–400)
RBC: 4 MIL/uL — ABNORMAL LOW (ref 4.22–5.81)
RDW: 12.8 % (ref 11.5–15.5)
WBC: 13.1 10*3/uL — ABNORMAL HIGH (ref 4.0–10.5)

## 2015-09-27 LAB — HIV ANTIBODY (ROUTINE TESTING W REFLEX): HIV Screen 4th Generation wRfx: NONREACTIVE

## 2015-09-27 LAB — TROPONIN I: Troponin I: <0.03 ng/mL (ref <0.031)

## 2015-09-27 LAB — LEGIONELLA ANTIGEN, URINE

## 2015-09-27 NOTE — Care Management Note (Signed)
Case Management Note  Patient Details  Name: Gabriel Gross MRN: [id number] Date of Birth: 1951-09-06  Subjective/Objective:                 Confirmed pna and failed outpt treatement   Action/Plan: Date: September 27, 2015 Chart reviewed for concurrent status and case management needs. Will continue to follow patient for changes and needs: Marcelle Smiling, RN, BSN, [place]   [phone]  Expected Discharge Date:   (unknown)               Expected Discharge Plan:  Home/Self Care  In-House Referral:  NA  Discharge planning Services  CM Consult  Post Acute Care Choice:  NA Choice offered to:  NA  DME Arranged:    DME Agency:     HH Arranged:    HH Agency:     Status of Service:  Completed, signed off  Medicare Important Message Given:    Date Medicare IM Given:    Medicare IM give by:    Date Additional Medicare IM Given:    Additional Medicare Important Message give by:     If discussed at Long Length of Stay Meetings, dates discussed:    Additional Comments:  Golda Acre, RN 09/27/2015, 11:44 AM

## 2015-09-27 NOTE — Progress Notes (Signed)
Patient ID: AZARIAN STARACE, male   DOB: [birth year], 65 y.o.   MRN: [id number] [facility] PROGRESS NOTE  LEEUM SANKEY NFA:[id number] DOB: 11-Mar-1951 DOA: 09/26/2015 PCP: Neldon Labella, MD  Brief narrative:    64 year old male with past medical history of dyslipidemia and hypertension, used to smoke but quit 40 years ago who presented to [facility] long hospital with worsening cough, shortness of breath. He was seen in urgent care and his symptoms were treated with Levaquin 500 mg daily by mouth for 7 days but his symptoms have not significantly improved.  Patient was hemodynamically stable on the admission. His blood work was notable for leukocytosis of 15.2, creatinine was 1.25 and potassium 3.4. Troponin level was within normal limits. Lactic acid was within normal limits. Chest x-ray and CT chest both concerning for development of pneumonia, bronchopneumonia. He was started on azithromycin and Rocephin and admitted for further management of pneumonia.  Assessment/Plan:    Principal Problem:   Acute respiratory failure with hypoxia (HCC) / Lobar pneumonia, unspecified organism (HCC) / Leukocytosis - Respiratory status stable and patient saturating 93% with nasal cannula oxygen support - Hypoxia likely from pneumonia - Chest x-ray on the admission showed increased airspace opacities in the left lower lobe  - CT chest on the admission demonstrated airspace consolidation in peribronchial distribution in the dependent portions of the bilateral lower lobes. - He was started on azithromycin and Rocephin  - Blood cultures, influenza, strep pneumonia and legionella all negative   Active Problems:     Dyslipidemia - Resume statin therapy    Hypokalemia - Likely from losartan/HCTZ - Repeat level within normal limits    AKI (acute kidney injury) (HCC) - Likely from pt home BP med: Losartan/hctz which was placed on hold - Creatinine now within normal limits    Benign essential HTN -  BP controlled without BP meds   DVT Prophylaxis  - Levaquin subQ ordered   Code Status: Full.  Family Communication:  plan of care discussed with the patient and his wife at the bedside Disposition Plan: Home likely by 09/29/2015  IV access:  Peripheral IV  Procedures and diagnostic studies:    Dg Chest 2 View 09/26/2015  Increased streaky airspace opacities in the left lower lobe in a peribronchovascular distribution. Differential considerations include subsegmental atelectasis in setting of acute airway inflammation or mucous plugging, acute on chronic bronchitis, and in the appropriate clinical setting, early bronchopneumonia. Electronically Signed   By: Malachy Moan M.D.   On: 09/26/2015 13:20   Ct Angio Chest Pe W/cm &/or Wo Cm 09/26/2015  Airspace consolidation in peribronchial distribution in the dependent portions of the bilateral lower lobes. Differential diagnosis includes subsegmental atelectasis in the settings of acute bronchitis, or early bronchopneumonia, to include aspiration pneumonia. The airways are patent.  No discernible by CT mucous plugs are seen. Probably reactive shotty anterior mediastinal lymph nodes. Electronically Signed   By: Ted Mcalpine M.D.   On: 09/26/2015 18:31   Medical Consultants:  None   Other Consultants:  None   IAnti-Infectives:   Azithromycin and Rocephin 09/26/2015 -->   Manson Passey, MD  [facility] Pager [phone]  Time spent in minutes: 25 minutes  If 7PM-7AM, please contact night-coverage [email or web address] Password [email or web address] 09/27/2015, 1:13 PM   LOS: 1 day    HPI/Subjective: No acute overnight events. Patient reports he feels better.   Objective: Filed Vitals:   09/26/15 1812 09/26/15 1854 09/26/15 2054 09/27/15 0447  BP: 110/60 122/73 107/68 112/70  Pulse: 78 78 79 57  Temp: 98.3 F (36.8 C) 98.6 F (37 C) 98.3 F (36.8 C) 97.3 F (36.3 C)  TempSrc: Oral Oral Oral Oral  Resp: Height:    (1.778 m)    Weight:  197 lb (89.359 kg)    SpO2: 95% 97% 93% 94%    Intake/Output Summary (Last 24 hours) at 09/27/15 1313 Last data filed at 09/27/15 0850  Gross per 24 hour  Intake 2505.83 ml  Output      0 ml  Net 2505.83 ml    Exam:   General:  Pt is alert, follows commands appropriately, not in acute distress  Cardiovascular: Regular rate and rhythm, S1/S2, no murmurs  Respiratory: diminished, coarse breath sounds, no wheezing  Abdomen: Soft, non tender, non distended, bowel sounds present  Extremities: No edema, pulses DP and PT palpable bilaterally  Neuro: Grossly nonfocal  Data Reviewed: Basic Metabolic Panel:  Recent Labs Lab 09/26/15 1231 09/26/15 1517 09/27/15 0249  NA 139  --  139  K 3.4*  --  4.8  CL 99*  --  104  CO2 27  --  26  GLUCOSE 134*  --  154*  BUN 26*  --  23*  CREATININE 1.25*  --  1.08  CALCIUM 9.1  --  8.6*  MG  --  2.0  --    Liver Function Tests:  Recent Labs Lab 09/27/15 0249  AST 30  ALT 51  ALKPHOS 75  BILITOT 0.9  PROT 6.6  ALBUMIN 2.9*   No results for input(s): LIPASE, AMYLASE in the last 168 hours. No results for input(s): AMMONIA in the last 168 hours. CBC:  Recent Labs Lab 09/26/15 1231 09/27/15 0249  WBC 15.2* 13.1*  HGB 14.1 12.8*  HCT 41.1 37.8*  MCV 93.4 94.5  PLT 482* 473*   Cardiac Enzymes:  Recent Labs Lab 09/26/15 1517 09/27/15 0240  TROPONINI <0.03 <0.03   BNP: Invalid input(s): POCBNP CBG:  Recent Labs Lab 09/26/15 1252  GLUCAP 109*    Recent Results (from the past 240 hour(s))  Culture, blood (Routine X 2) w Reflex to ID Panel     Status: None (Preliminary result)   Collection Time: 09/26/15 12:32 PM  Result Value Ref Range Status   Specimen Description BLOOD RIGHT ANTECUBITAL  Final   Special Requests BOTTLES DRAWN AEROBIC AND ANAEROBIC 5 CC EA  Final   Culture   Final    NO GROWTH < 24 HOURS Performed at [facility]    Report Status PENDING  Incomplete   Culture, blood (Routine X 2) w Reflex to ID Panel     Status: None (Preliminary result)   Collection Time: 09/26/15 12:53 PM  Result Value Ref Range Status   Specimen Description BLOOD LEFT ARM  Final   Special Requests BOTTLES DRAWN AEROBIC AND ANAEROBIC  Final   Culture   Final    NO GROWTH < 24 HOURS Performed at [facility]    Report Status PENDING  Incomplete     Scheduled Meds: . azithromycin  500 mg Intravenous Q24H  . cefTRIAXone (ROCEPHIN)  IV  1 g Intravenous Q24H  . enoxaparin (LOVENOX) injection  40 mg Subcutaneous Q24H  . pantoprazole  40 mg Oral Daily  . predniSONE  40 mg Oral Q breakfast  . simvastatin  20 mg Oral QPM   Continuous Infusions: . 0.9 % NaCl with KCl 20 mEq / L 50 mL/hr  at 09/27/15 [id number]

## 2015-09-28 DIAGNOSIS — J9601 Acute respiratory failure with hypoxia: Secondary | ICD-10-CM

## 2015-09-28 LAB — PROCALCITONIN

## 2015-09-28 LAB — RESPIRATORY VIRUS PANEL
ADENOVIRUS: NEGATIVE
Influenza A: NEGATIVE
Influenza B: NEGATIVE
METAPNEUMOVIRUS: NEGATIVE
PARAINFLUENZA 1 A: NEGATIVE
PARAINFLUENZA 2 A: NEGATIVE
Parainfluenza 3: NEGATIVE
RESPIRATORY SYNCYTIAL VIRUS B: NEGATIVE
RHINOVIRUS: NEGATIVE
Respiratory Syncytial Virus A: NEGATIVE

## 2015-09-28 MED ORDER — PREDNISONE 20 MG PO TABS: 40.0000 mg | ORAL_TABLET | Freq: Every day | ORAL | Status: AC

## 2015-09-28 MED ORDER — AZITHROMYCIN 250 MG PO TABS: ORAL_TABLET | ORAL | Status: AC

## 2015-09-28 MED ORDER — CEFDINIR 300 MG PO CAPS: 300.0000 mg | ORAL_CAPSULE | Freq: Two times a day (BID) | ORAL | Status: AC

## 2015-09-28 NOTE — Discharge Summary (Signed)
Physician Discharge Summary  Gabriel Gross:[id number] DOB: 1951/07/23 DOA: 09/26/2015  PCP: Neldon Labella, MD  Admit date: 09/26/2015 Discharge date: 09/28/2015  Time spent: > 35 minutes  Recommendations for Outpatient Follow-up:  1. Monitor WBC count 2. Decide when to continue antihypertensives   Discharge Diagnoses:  Principal Problem:   Acute respiratory failure with hypoxia (HCC) Active Problems:   Benign essential HTN   Dyslipidemia   Lobar pneumonia, unspecified organism (HCC)   Hypokalemia   AKI (acute kidney injury) (HCC)   Leukocytosis   Discharge Condition: Stable  Diet recommendation: Regular diet  Filed Weights   09/26/15 1212 09/26/15 1854  Weight: 89.812 kg (198 lb) 89.359 kg (197 lb)    History of present illness:  From original history of present illness:   Hospital Course:  Patient is a 65 year old with history dyslipidemia, hypertension, previous smoker who quit 40 years ago, who presented with one week complaint of shortness of breath and cough.  Procedures:  None  Consultations:  None  Discharge Exam: Filed Vitals:   09/28/15 1000 09/28/15 1345  BP: 105/60 107/67  Pulse: 51 69  Temp: 97.7 F (36.5 C) 97.7 F (36.5 C)  Resp: 18 18    General: Pt in nad, alert and awake Cardiovascular: rrr, no mrg Respiratory: cta bl, no wheezes, no increased wob, breathing comfortably on room air  Discharge Instructions   Discharge Instructions    Call MD for:  difficulty breathing, headache or visual disturbances    Complete by:  As directed      Call MD for:  temperature >100.4    Complete by:  As directed      Diet - low sodium heart healthy    Complete by:  As directed      Discharge instructions    Complete by:  As directed   Please be sure to follow up with your primary care physician in 1-2 weeks or sooner should any new concerns arise.     Increase activity slowly    Complete by:  As directed           Current Discharge  Medication List    START taking these medications   Details  azithromycin (ZITHROMAX) 250 MG tablet Take 1 tablet by mouth daily Qty: 5 each, Refills: 0    cefdinir (OMNICEF) 300 MG capsule Take 1 capsule (300 mg total) by mouth 2 (two) times daily. Qty: 10 capsule, Refills: 0    predniSONE (DELTASONE) 20 MG tablet Take 2 tablets (40 mg total) by mouth daily with breakfast. Qty: 2 tablet, Refills: 0      CONTINUE these medications which have NOT CHANGED   Details  acetaminophen (TYLENOL) 500 MG tablet Take 1,000 mg by mouth every 6 (six) hours as needed. For pain    guaiFENesin (ROBITUSSIN) 100 MG/5ML liquid Take 200 mg by mouth 3 (three) times daily as needed for cough.    omeprazole (PRILOSEC) 20 MG capsule Take 20 mg by mouth every other day.    simvastatin (ZOCOR) 20 MG tablet Take 20 mg by mouth daily.      STOP taking these medications     ibuprofen (ADVIL,MOTRIN) 200 MG tablet      levofloxacin (LEVAQUIN) 500 MG tablet      losartan-hydrochlorothiazide (HYZAAR) 100-25 MG tablet        Allergies  Allergen Reactions  . Penicillins Other (See Comments)    Childhood reaction Has patient had a PCN reaction causing immediate rash, facial/tongue/throat swelling,  SOB or lightheadedness with hypotension: unknown Has patient had a PCN reaction causing severe rash involving mucus membranes or skin necrosis: unknown Has patient had a PCN reaction that required hospitalization unknown Has patient had a PCN reaction occurring within the last 10 years: No If all of the above answers are "NO", then may proceed with Cephalosporin use.       The results of significant diagnostics from this hospitalization (including imaging, microbiology, ancillary and laboratory) are listed below for reference.    Significant Diagnostic Studies: Dg Chest 2 View  09/26/2015  CLINICAL DATA:  65 year old male with shortness breath EXAM: CHEST  2 VIEW COMPARISON:  Prior chest x-ray 09/21/2015  FINDINGS: Cardiac and mediastinal contours are within normal limits. Atherosclerotic calcifications again noted in the transverse aorta. Slightly increased streaky airspace opacities in the left lower lobe compared to prior imaging. Otherwise, the lungs are clear save for mild chronic bronchitic change. No pleural effusion, edema or pneumothorax. No acute osseous abnormality. Visualized osseous structures demonstrate no acute abnormality. IMPRESSION: Increased streaky airspace opacities in the left lower lobe in a peribronchovascular distribution. Differential considerations include subsegmental atelectasis in setting of acute airway inflammation or mucous plugging, acute on chronic bronchitis, and in the appropriate clinical setting, early bronchopneumonia. Electronically Signed   By: Malachy Moan M.D.   On: 09/26/2015 13:20   Ct Angio Chest Pe W/cm &/or Wo Cm  09/26/2015  CLINICAL DATA:  One week history of progressive shortness of breath, cough and pleuritic chest pain. Patient is being treated for pneumonia. EXAM: CT ANGIOGRAPHY CHEST WITH CONTRAST TECHNIQUE: Multidetector CT imaging of the chest was performed using the standard protocol during bolus administration of intravenous contrast. Multiplanar CT image reconstructions and MIPs were obtained to evaluate the vascular anatomy. CONTRAST:  OMNIPAQUE IOHEXOL 350 MG/ML SOLN COMPARISON:  Chest radiograph 09/26/2015 FINDINGS: No pericardial effusion. Thoracic aorta is normal in caliber. No evidence of pulmonary embolus. Visualized thyroid is unremarkable. No pathologically enlarged hilar, mediastinal, or axillary lymph nodes are seen. There are shotty anterior mediastinal lymph nodes. Airways are patent. There is bilateral patchy airspace consolidation in peribronchial distribution in the dependent portions of the bilateral lower lobes. No pleural effusion, or pneumothorax is identified. Visualized upper abdomen is unremarkable. No evidence of acute  osseous abnormality. Review of the MIP images confirms the above findings. IMPRESSION: Airspace consolidation in peribronchial distribution in the dependent portions of the bilateral lower lobes. Differential diagnosis includes subsegmental atelectasis in the settings of acute bronchitis, or early bronchopneumonia, to include aspiration pneumonia. The airways are patent.  No discernible by CT mucous plugs are seen. Probably reactive shotty anterior mediastinal lymph nodes. Electronically Signed   By: Ted Mcalpine M.D.   On: 09/26/2015 18:31    Microbiology: Recent Results (from the past 240 hour(s))  Culture, blood (Routine X 2) w Reflex to ID Panel     Status: None (Preliminary result)   Collection Time: 09/26/15 12:32 PM  Result Value Ref Range Status   Specimen Description BLOOD RIGHT ANTECUBITAL  Final   Special Requests BOTTLES DRAWN AEROBIC AND ANAEROBIC 5 CC EA  Final   Culture   Final    NO GROWTH 2 DAYS Performed at [facility]    Report Status PENDING  Incomplete  Culture, blood (Routine X 2) w Reflex to ID Panel     Status: None (Preliminary result)   Collection Time: 09/26/15 12:53 PM  Result Value Ref Range Status   Specimen Description BLOOD LEFT ARM  Final   Special Requests BOTTLES DRAWN AEROBIC AND ANAEROBIC  Final   Culture   Final    NO GROWTH 2 DAYS Performed at [facility]    Report Status PENDING  Incomplete     Labs: Basic Metabolic Panel:  Recent Labs Lab 09/26/15 1231 09/26/15 1517 09/27/15 0249  NA 139  --  139  K 3.4*  --  4.8  CL 99*  --  104  CO2 27  --  26  GLUCOSE 134*  --  154*  BUN 26*  --  23*  CREATININE 1.25*  --  1.08  CALCIUM 9.1  --  8.6*  MG  --  2.0  --    Liver Function Tests:  Recent Labs Lab 09/27/15 0249  AST 30  ALT 51  ALKPHOS 75  BILITOT 0.9  PROT 6.6  ALBUMIN 2.9*   No results for input(s): LIPASE, AMYLASE in the last 168 hours. No results for input(s): AMMONIA in the last 168  hours. CBC:  Recent Labs Lab 09/26/15 1231 09/27/15 0249  WBC 15.2* 13.1*  HGB 14.1 12.8*  HCT 41.1 37.8*  MCV 93.4 94.5  PLT 482* 473*   Cardiac Enzymes:  Recent Labs Lab 09/26/15 1517 09/27/15 0240  TROPONINI <0.03 <0.03   BNP: BNP (last 3 results) No results for input(s): BNP in the last 8760 hours.  ProBNP (last 3 results) No results for input(s): PROBNP in the last 8760 hours.  CBG:  Recent Labs Lab 09/26/15 1252  GLUCAP 109*     Signed:  Penny Pia MD.  [facility] 09/28/2015, 3:04 PM

## 2015-09-28 NOTE — Progress Notes (Signed)
Gabriel Gross to be D/C'd Home per MD order.  Discussed prescriptions and follow up appointments with the patient. Prescriptions given to patient, medication list explained in detail. Pt verbalized understanding.    Medication List    STOP taking these medications        ibuprofen 200 MG tablet  Commonly known as:  ADVIL,MOTRIN     levofloxacin 500 MG tablet  Commonly known as:  LEVAQUIN     losartan-hydrochlorothiazide 100-25 MG tablet  Commonly known as:  HYZAAR      TAKE these medications        acetaminophen 500 MG tablet  Commonly known as:  TYLENOL  Take 1,000 mg by mouth every 6 (six) hours as needed. For pain     azithromycin 250 MG tablet  Commonly known as:  ZITHROMAX  Take 1 tablet by mouth daily     cefdinir 300 MG capsule  Commonly known as:  OMNICEF  Take 1 capsule (300 mg total) by mouth 2 (two) times daily.     guaiFENesin 100 MG/5ML liquid  Commonly known as:  ROBITUSSIN  Take 200 mg by mouth 3 (three) times daily as needed for cough.     omeprazole 20 MG capsule  Commonly known as:  PRILOSEC  Take 20 mg by mouth every other day.     predniSONE 20 MG tablet  Commonly known as:  DELTASONE  Take 2 tablets (40 mg total) by mouth daily with breakfast.  Start taking on:  09/29/2015     simvastatin 20 MG tablet  Commonly known as:  ZOCOR  Take 20 mg by mouth daily.        Filed Vitals:   09/28/15 1000 09/28/15 1345  BP: 105/60 107/67  Pulse: 51 69  Temp: 97.7 F (36.5 C) 97.7 F (36.5 C)  Resp: 18 18    Skin clean, dry and intact without evidence of skin break down, no evidence of skin tears noted. IV catheter discontinued intact. Site without signs and symptoms of complications. Dressing and pressure applied. Pt denies pain at this time. No complaints noted.  An After Visit Summary was printed and given to the patient. Patient escorted via WC, and D/C home via private auto.  [name] 09/28/2015 3:51 PM

## 2015-10-01 LAB — CULTURE, BLOOD (ROUTINE X 2)
CULTURE: NO GROWTH
Culture: NO GROWTH

## 2016-01-23 ENCOUNTER — Ambulatory Visit
Admission: RE | Admit: 2016-01-23 | Discharge: 2016-01-23 | Disposition: A | Discharge status: Home / Self Care | Payer: BLUE CROSS/BLUE SHIELD | Source: Ambulatory Visit | Attending: Cardiology | Admitting: Cardiology

## 2016-01-23 ENCOUNTER — Other Ambulatory Visit: Payer: Self-pay | Admitting: Cardiology

## 2016-01-23 DIAGNOSIS — R0600 Dyspnea, unspecified: Secondary | ICD-10-CM

## 2017-04-27 IMAGING — CT CT ANGIO CHEST
2 of 6 series · 18 of 36 positions shown · IV contrast (OMNIPAQUE 350)
Comparison: Chest radiograph 09/26/2015

CLINICAL DATA: One week history of progressive shortness of breath,
cough and pleuritic chest pain. Patient is being treated for
pneumonia.

EXAM:
CT ANGIOGRAPHY CHEST WITH CONTRAST
TECHNIQUE: Multidetector CT imaging of the chest was performed using the
standard protocol during bolus administration of intravenous
contrast. Multiplanar CT image reconstructions and MIPs were
obtained to evaluate the vascular anatomy.
CONTRAST:  100mL OMNIPAQUE IOHEXOL 350 MG/ML SOLN

[Series 6: coronal mpr · coronal · 0.44mm/px · 1 of 119 slices shown]
[im 60/119  mediastinal]
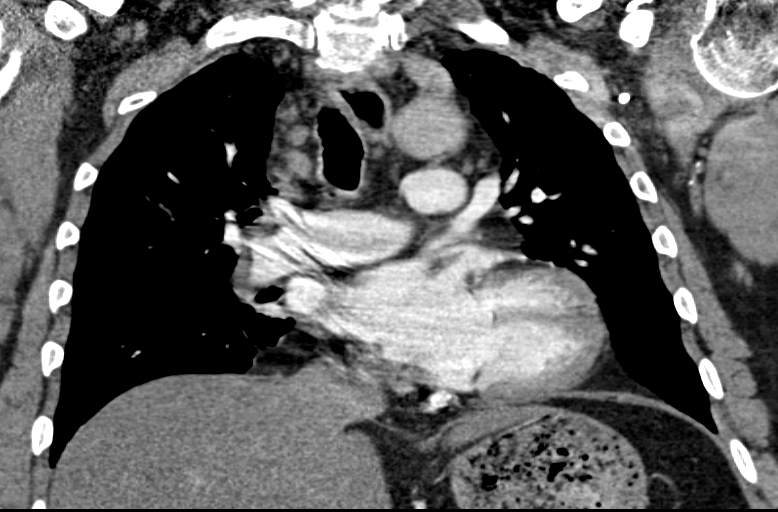

[Series 11: thins for pacs · axial · 0.78mm/px · z∈[-156,+46]mm · 17 of 227 slices shown]
[im 12/227  lung]
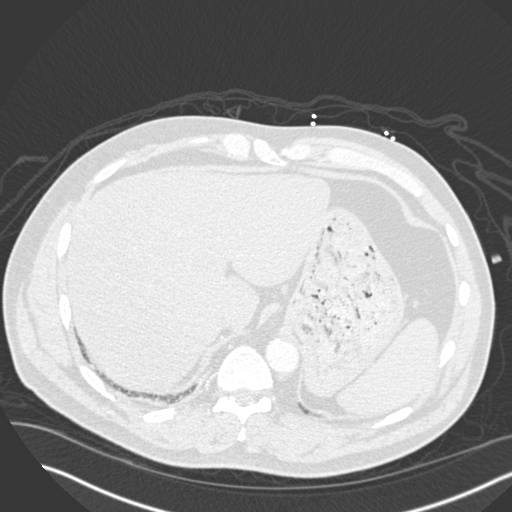
[im 23/227  mediastinal]
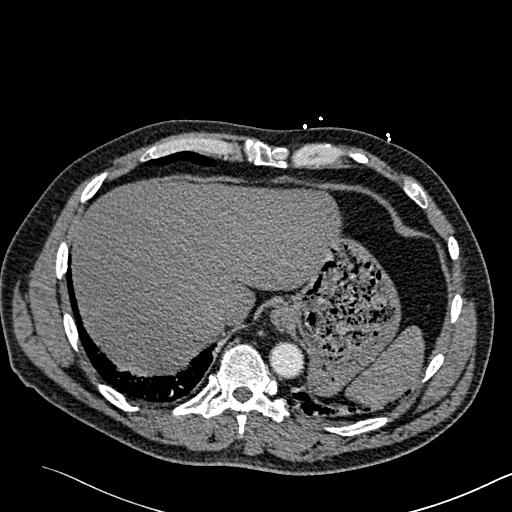
[im 34/227  lung]
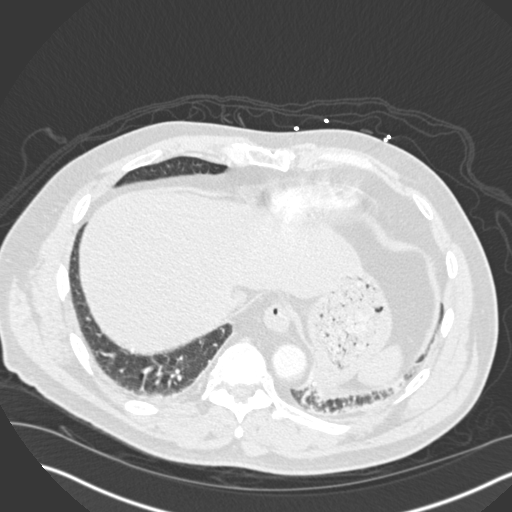
[im 46/227  mediastinal]
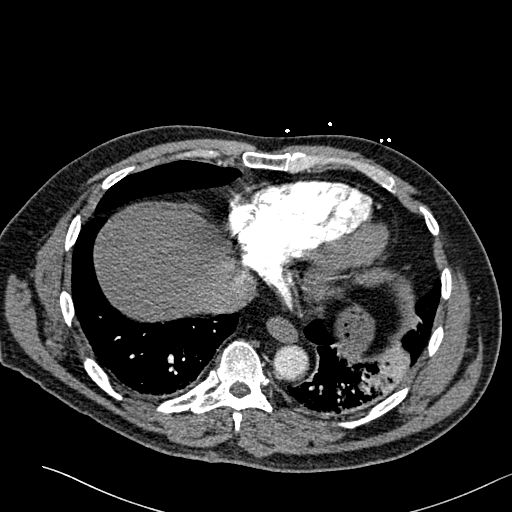
[im 68/227  lung]
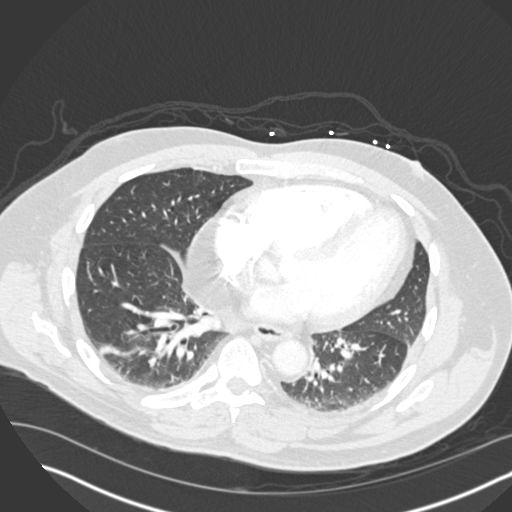
[im 80/227  mediastinal]
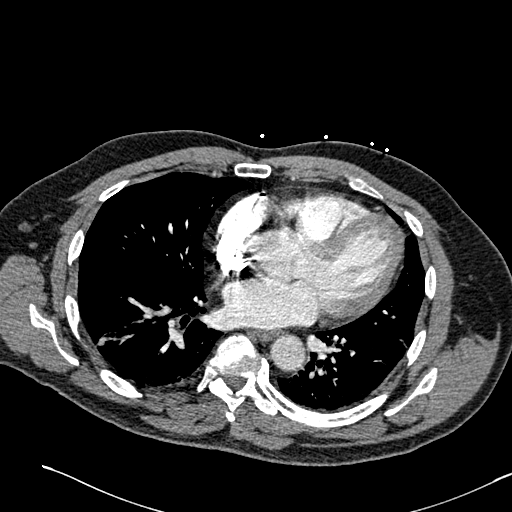
[im 91/227  lung]
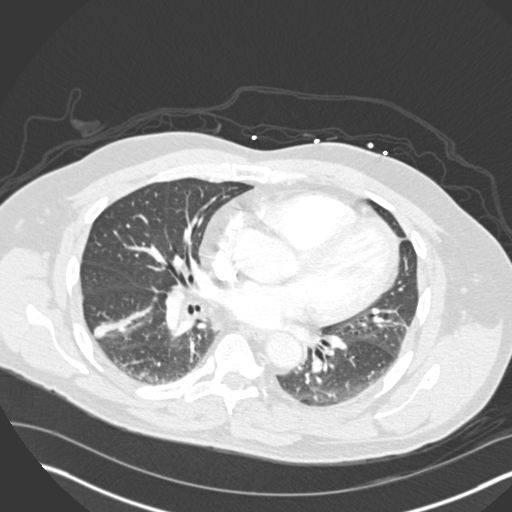
[im 102/227  mediastinal]
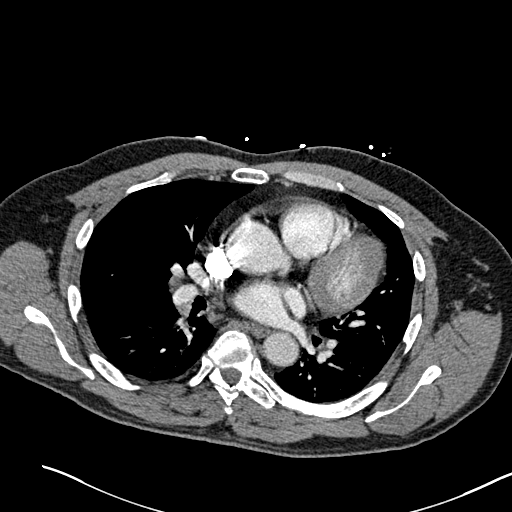
[im 114/227  lung]
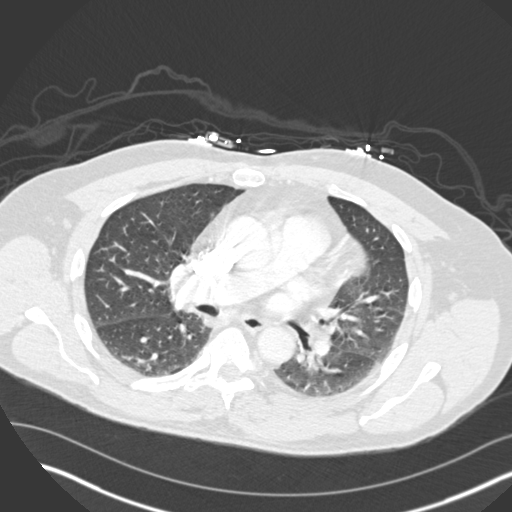
[im 125/227  mediastinal]
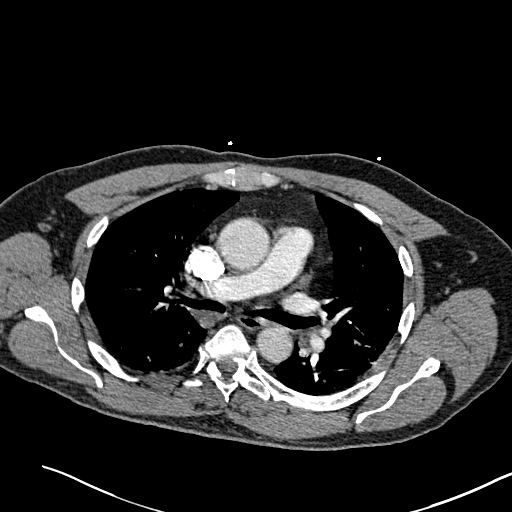
[im 136/227  lung]
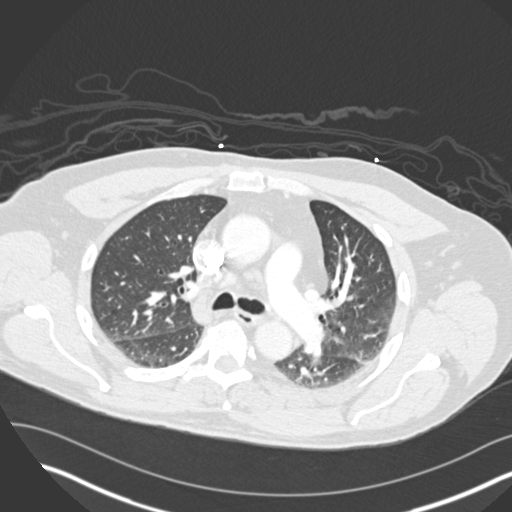
[im 147/227  mediastinal]
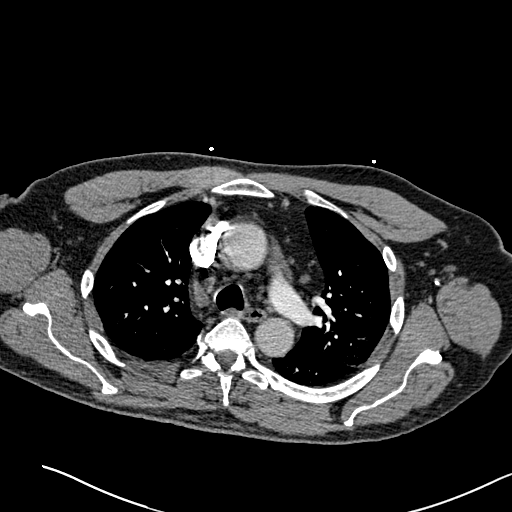
[im 159/227  lung]
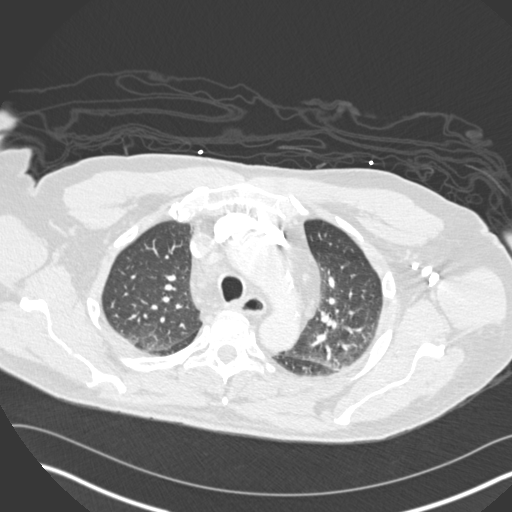
[im 181/227  mediastinal]
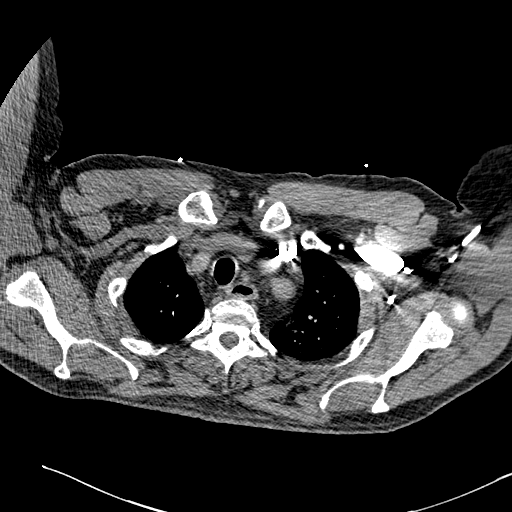
[im 193/227  lung]
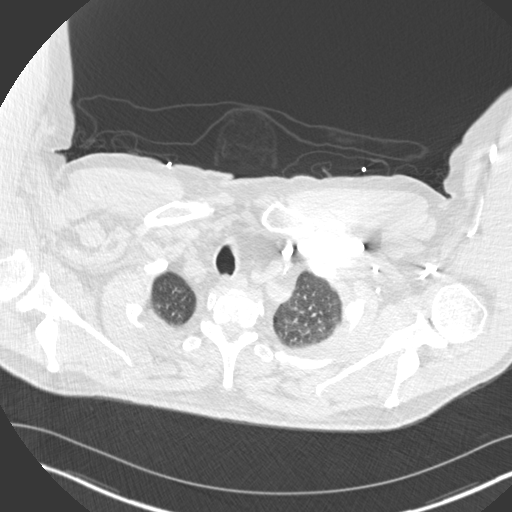
[im 204/227  mediastinal]
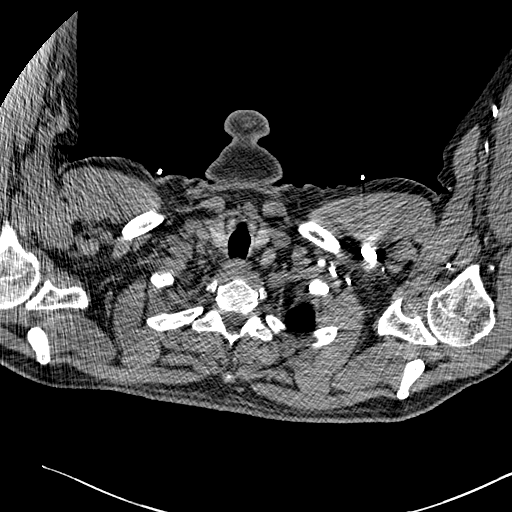
[im 215/227  lung]
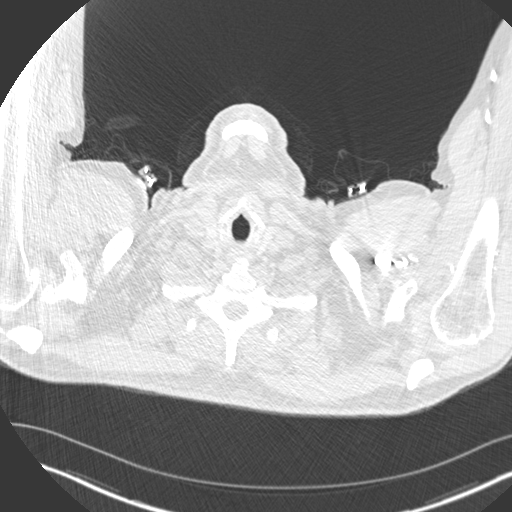

[18 of 36 positions shown; findings below may reference images not displayed]

FINDINGS: No pericardial effusion. Thoracic aorta is normal in caliber. No
evidence of pulmonary embolus. Visualized thyroid is unremarkable.

No pathologically enlarged hilar, mediastinal, or axillary lymph
nodes are seen. There are shotty anterior mediastinal lymph nodes.
Airways are patent. There is bilateral patchy airspace consolidation
in peribronchial distribution in the dependent portions of the
bilateral lower lobes. No pleural effusion, or pneumothorax is
identified.

Visualized upper abdomen is unremarkable. No evidence of acute
osseous abnormality.

Review of the MIP images confirms the above findings.
IMPRESSION: Airspace consolidation in peribronchial distribution in the
dependent portions of the bilateral lower lobes. Differential
diagnosis includes subsegmental atelectasis in the settings of acute
bronchitis, or early bronchopneumonia, to include aspiration
pneumonia.

The airways are patent.  No discernible by CT mucous plugs are seen.

Probably reactive shotty anterior mediastinal lymph nodes.

## 2017-04-27 IMAGING — CR DG CHEST 2V
2 series · 2 of 2 positions shown · non-contrast
Comparison: Prior chest x-ray 09/21/2015

CLINICAL DATA: 64-year-old male with shortness breath

EXAM:
CHEST  2 VIEW

[w chest pa]
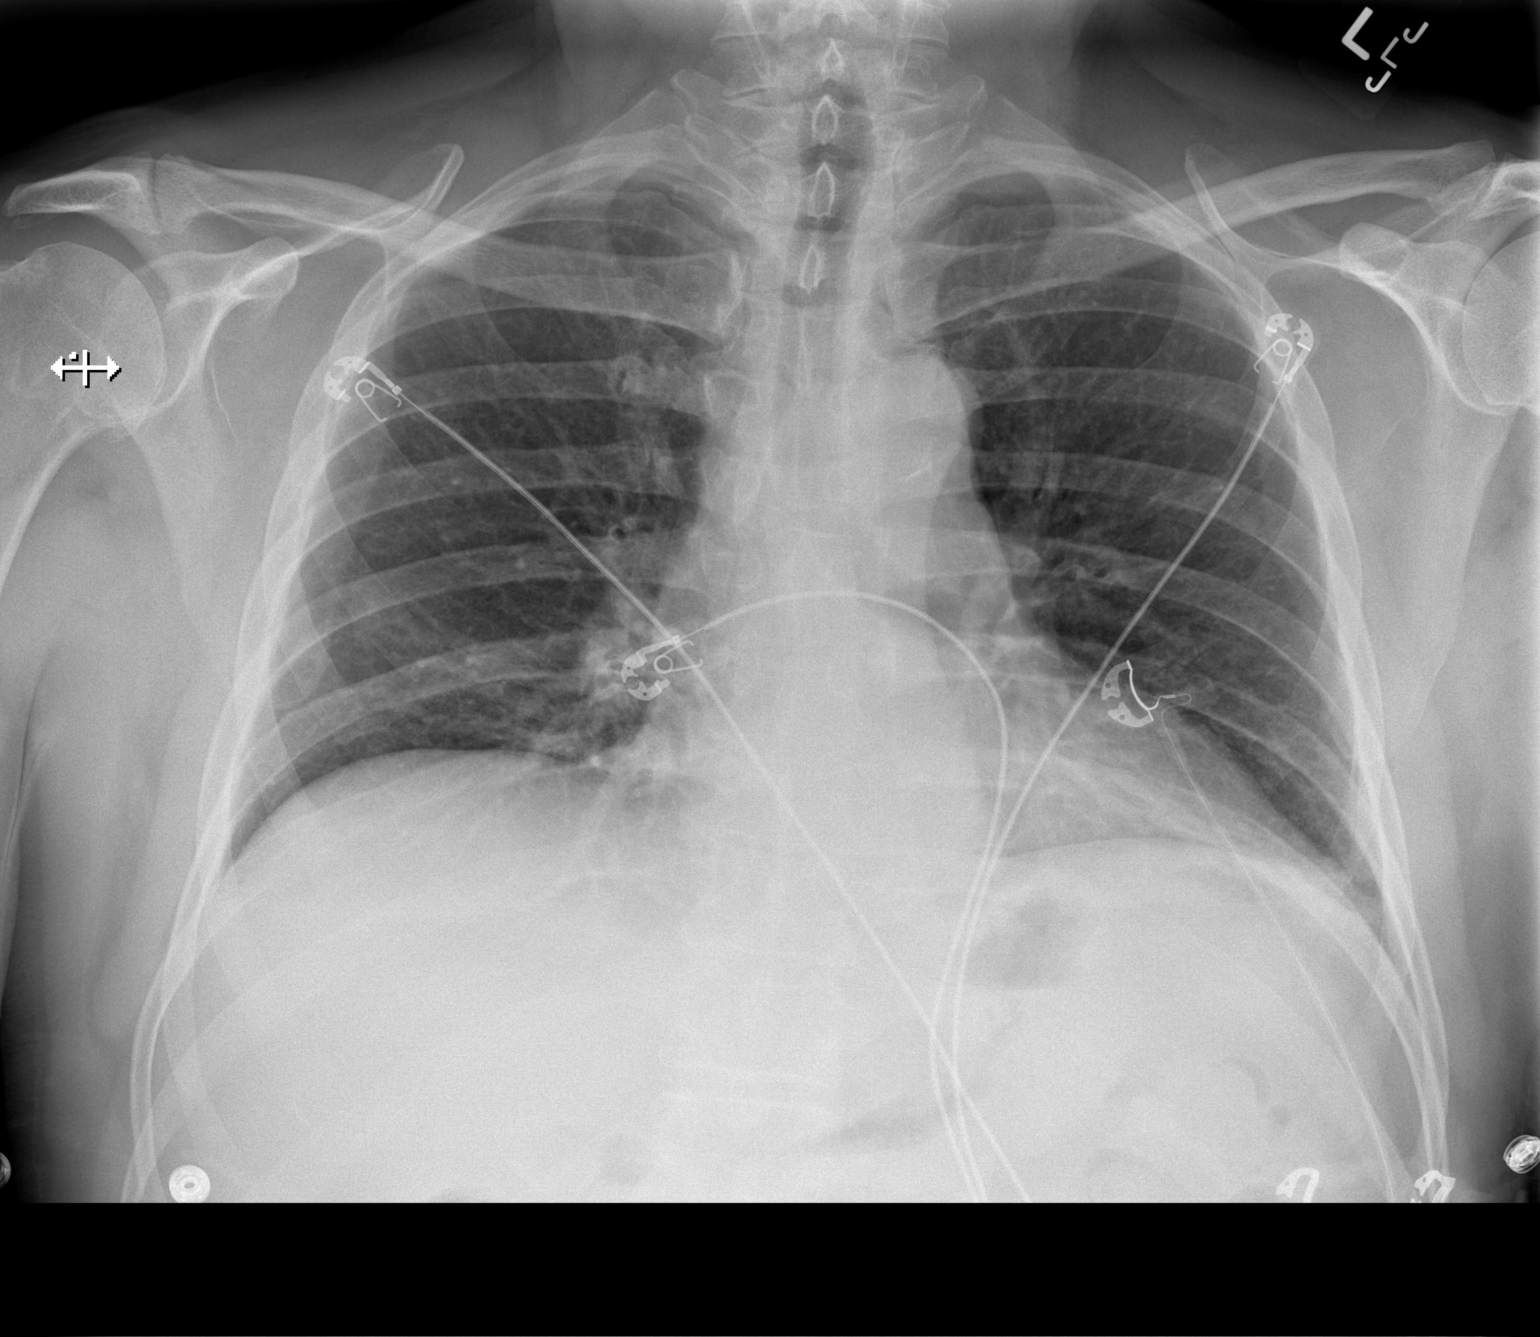

[w chest lat]
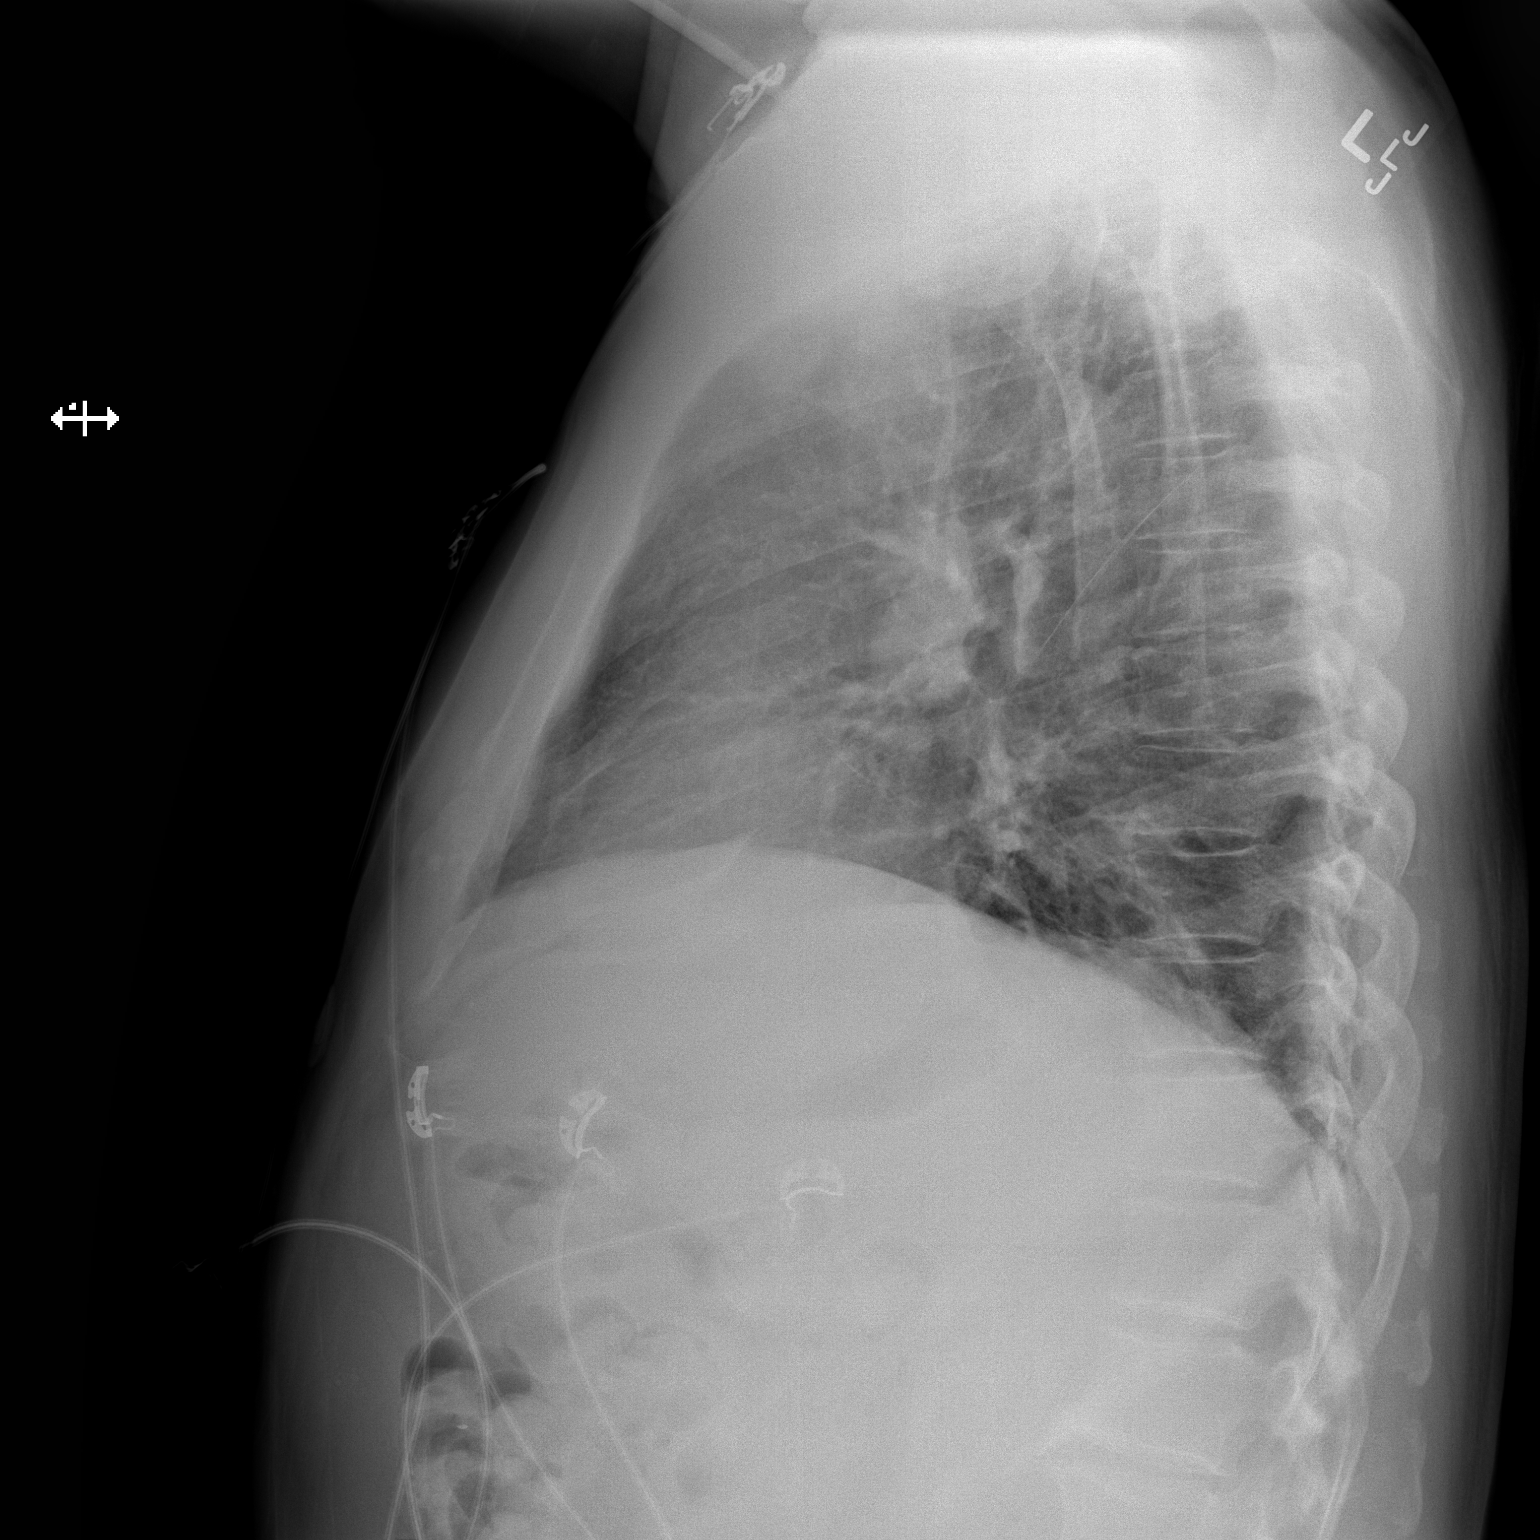

[2 of 2 positions shown; findings below may reference images not displayed]

FINDINGS: Cardiac and mediastinal contours are within normal limits.
Atherosclerotic calcifications again noted in the transverse aorta.
Slightly increased streaky airspace opacities in the left lower lobe
compared to prior imaging. Otherwise, the lungs are clear save for
mild chronic bronchitic change. No pleural effusion, edema or
pneumothorax. No acute osseous abnormality. Visualized osseous
structures demonstrate no acute abnormality.
IMPRESSION: Increased streaky airspace opacities in the left lower lobe in a
peribronchovascular distribution. Differential considerations
include subsegmental atelectasis in setting of acute airway
inflammation or mucous plugging, acute on chronic bronchitis, and in
the appropriate clinical setting, early bronchopneumonia.
